# Patient Record
Sex: Female | Born: 1990 | Race: Black or African American | Hispanic: No | Marital: Single | Smoking: Never smoker
Health system: Southern US, Community
[De-identification: ages and names within clinical notes are randomized; demographics above are authoritative.]

## PROBLEM LIST (undated history)

## (undated) DIAGNOSIS — O24419 Gestational diabetes mellitus in pregnancy, unspecified control: Secondary | ICD-10-CM

## (undated) DIAGNOSIS — R87629 Unspecified abnormal cytological findings in specimens from vagina: Secondary | ICD-10-CM

## (undated) DIAGNOSIS — D649 Anemia, unspecified: Secondary | ICD-10-CM

## (undated) HISTORY — DX: Unspecified abnormal cytological findings in specimens from vagina: R87.629

## (undated) HISTORY — DX: Gestational diabetes mellitus in pregnancy, unspecified control: O24.419

## (undated) HISTORY — PX: WISDOM TOOTH EXTRACTION: SHX21

---
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
## 2010-12-17 ENCOUNTER — Inpatient Hospital Stay (INDEPENDENT_AMBULATORY_CARE_PROVIDER_SITE_OTHER)
Admission: RE | Admit: 2010-12-17 | Discharge: 2010-12-17 | Disposition: A | Discharge status: Home / Self Care | Payer: PRIVATE HEALTH INSURANCE | Source: Ambulatory Visit | Attending: Family Medicine | Admitting: Family Medicine

## 2010-12-17 DIAGNOSIS — N390 Urinary tract infection, site not specified: Secondary | ICD-10-CM

## 2010-12-17 LAB — POCT URINALYSIS DIP (DEVICE)
Bilirubin Urine: NEGATIVE
Glucose, UA: NEGATIVE mg/dL
Hgb urine dipstick: NEGATIVE
Ketones, ur: NEGATIVE mg/dL
Nitrite: POSITIVE — AB
Protein, ur: NEGATIVE mg/dL
Specific Gravity, Urine: 1.02 (ref 1.005–1.030)
Urobilinogen, UA: 0.2 mg/dL (ref 0.0–1.0)
pH: 6 (ref 5.0–8.0)

## 2010-12-17 LAB — POCT PREGNANCY, URINE: Preg Test, Ur: NEGATIVE

## 2010-12-30 ENCOUNTER — Inpatient Hospital Stay (INDEPENDENT_AMBULATORY_CARE_PROVIDER_SITE_OTHER)
Admission: RE | Admit: 2010-12-30 | Discharge: 2010-12-30 | Disposition: A | Discharge status: Home / Self Care | Payer: PRIVATE HEALTH INSURANCE | Source: Ambulatory Visit | Attending: Family Medicine | Admitting: Family Medicine

## 2010-12-30 DIAGNOSIS — N390 Urinary tract infection, site not specified: Secondary | ICD-10-CM

## 2010-12-30 LAB — POCT URINALYSIS DIP (DEVICE)
Bilirubin Urine: NEGATIVE
Glucose, UA: NEGATIVE mg/dL
Ketones, ur: NEGATIVE mg/dL
Nitrite: NEGATIVE
Protein, ur: 30 mg/dL — AB
Specific Gravity, Urine: >=1.03 (ref 1.005–1.030)
Urobilinogen, UA: 0.2 mg/dL (ref 0.0–1.0)
pH: 6 (ref 5.0–8.0)

## 2010-12-30 LAB — POCT PREGNANCY, URINE: Preg Test, Ur: NEGATIVE

## 2010-12-31 LAB — URINE CULTURE
Colony Count: NO GROWTH
Culture  Setup Time: 201210151126
Culture: NO GROWTH

## 2013-06-01 ENCOUNTER — Encounter (HOSPITAL_COMMUNITY): Payer: Self-pay | Admitting: Emergency Medicine

## 2013-06-01 ENCOUNTER — Emergency Department (HOSPITAL_COMMUNITY)
Admission: EM | Admit: 2013-06-01 | Discharge: 2013-06-01 | Disposition: A | Discharge status: Home / Self Care | Payer: PRIVATE HEALTH INSURANCE | Attending: Emergency Medicine | Admitting: Emergency Medicine

## 2013-06-01 DIAGNOSIS — Z79899 Other long term (current) drug therapy: Secondary | ICD-10-CM | POA: Insufficient documentation

## 2013-06-01 DIAGNOSIS — Z3202 Encounter for pregnancy test, result negative: Secondary | ICD-10-CM | POA: Insufficient documentation

## 2013-06-01 DIAGNOSIS — K529 Noninfective gastroenteritis and colitis, unspecified: Secondary | ICD-10-CM

## 2013-06-01 DIAGNOSIS — K5289 Other specified noninfective gastroenteritis and colitis: Secondary | ICD-10-CM | POA: Insufficient documentation

## 2013-06-01 DIAGNOSIS — Z87891 Personal history of nicotine dependence: Secondary | ICD-10-CM | POA: Insufficient documentation

## 2013-06-01 LAB — CBC WITH DIFFERENTIAL/PLATELET
Basophils Absolute: 0 10*3/uL (ref 0.0–0.1)
Basophils Relative: 1 % (ref 0–1)
Eosinophils Absolute: 0.2 10*3/uL (ref 0.0–0.7)
Eosinophils Relative: 3 % (ref 0–5)
HCT: 38.6 % (ref 36.0–46.0)
Hemoglobin: 12.4 g/dL (ref 12.0–15.0)
Lymphocytes Relative: 39 % (ref 12–46)
Lymphs Abs: 2.1 10*3/uL (ref 0.7–4.0)
MCH: 25.6 pg — ABNORMAL LOW (ref 26.0–34.0)
MCHC: 32.1 g/dL (ref 30.0–36.0)
MCV: 79.8 fL (ref 78.0–100.0)
Monocytes Absolute: 0.4 10*3/uL (ref 0.1–1.0)
Monocytes Relative: 8 % (ref 3–12)
Neutro Abs: 2.7 10*3/uL (ref 1.7–7.7)
Neutrophils Relative %: 50 % (ref 43–77)
Platelets: 322 10*3/uL (ref 150–400)
RBC: 4.84 MIL/uL (ref 3.87–5.11)
RDW: 13.6 % (ref 11.5–15.5)
WBC: 5.5 10*3/uL (ref 4.0–10.5)

## 2013-06-01 LAB — COMPREHENSIVE METABOLIC PANEL
ALT: 29 U/L (ref 0–35)
AST: 32 U/L (ref 0–37)
Albumin: 3.7 g/dL (ref 3.5–5.2)
Alkaline Phosphatase: 45 U/L (ref 39–117)
BUN: 6 mg/dL (ref 6–23)
CO2: 25 mEq/L (ref 19–32)
Calcium: 8.9 mg/dL (ref 8.4–10.5)
Chloride: 101 mEq/L (ref 96–112)
Creatinine, Ser: 0.62 mg/dL (ref 0.50–1.10)
GFR calc Af Amer: >90 mL/min (ref >90)
GFR calc non Af Amer: >90 mL/min (ref >90)
Glucose, Bld: 81 mg/dL (ref 70–99)
Potassium: 4.1 mEq/L (ref 3.7–5.3)
Sodium: 137 mEq/L (ref 137–147)
Total Bilirubin: 0.2 mg/dL — ABNORMAL LOW (ref 0.3–1.2)
Total Protein: 7.5 g/dL (ref 6.0–8.3)

## 2013-06-01 LAB — URINALYSIS, ROUTINE W REFLEX MICROSCOPIC
Bilirubin Urine: NEGATIVE
Glucose, UA: NEGATIVE mg/dL
Ketones, ur: NEGATIVE mg/dL
Leukocytes, UA: NEGATIVE
Nitrite: NEGATIVE
Protein, ur: NEGATIVE mg/dL
Specific Gravity, Urine: 1.006 (ref 1.005–1.030)
Urobilinogen, UA: 0.2 mg/dL (ref 0.0–1.0)
pH: 6.5 (ref 5.0–8.0)

## 2013-06-01 LAB — URINE MICROSCOPIC-ADD ON

## 2013-06-01 LAB — LIPASE, BLOOD: Lipase: 22 U/L (ref 11–59)

## 2013-06-01 LAB — POC URINE PREG, ED: Preg Test, Ur: NEGATIVE

## 2013-06-01 MED ORDER — LOPERAMIDE HCL 2 MG PO CAPS
4.0000 mg | ORAL_CAPSULE | ORAL | Status: DC | PRN
  Administered 2013-06-01: 4 mg via ORAL
  Filled 2013-06-01: qty 2

## 2013-06-01 MED ORDER — LOPERAMIDE HCL 2 MG PO CAPS: 2.0000 mg | ORAL_CAPSULE | Freq: Four times a day (QID) | ORAL | Status: DC | PRN

## 2013-06-01 NOTE — ED Notes (Signed)
Pt provided ginger ale.  

## 2013-06-01 NOTE — Discharge Instructions (Signed)
Viral Gastroenteritis Viral gastroenteritis is also known as stomach flu. This condition affects the stomach and intestinal tract. It can cause sudden diarrhea and vomiting. The illness typically lasts 3 to 8 days. Most people develop an immune response that eventually gets rid of the virus. While this natural response develops, the virus can make you quite ill. CAUSES  Many different viruses can cause gastroenteritis, such as rotavirus or noroviruses. You can catch one of these viruses by consuming contaminated food or water. You may also catch a virus by sharing utensils or other personal items with an infected person or by touching a contaminated surface. SYMPTOMS  The most common symptoms are diarrhea and vomiting. These problems can cause a severe loss of body fluids (dehydration) and a body salt (electrolyte) imbalance. Other symptoms may include:  Fever.  Headache.  Fatigue.  Abdominal pain. DIAGNOSIS  Your caregiver can usually diagnose viral gastroenteritis based on your symptoms and a physical exam. A stool sample may also be taken to test for the presence of viruses or other infections. TREATMENT  This illness typically goes away on its own. Treatments are aimed at rehydration. The most serious cases of viral gastroenteritis involve vomiting so severely that you are not able to keep fluids down. In these cases, fluids must be given through an intravenous line (IV). HOME CARE INSTRUCTIONS   Drink enough fluids to keep your urine clear or pale yellow. Drink small amounts of fluids frequently and increase the amounts as tolerated.  Ask your caregiver for specific rehydration instructions.  Avoid:  Foods high in sugar.  Alcohol.  Carbonated drinks.  Tobacco.  Juice.  Caffeine drinks.  Extremely hot or cold fluids.  Fatty, greasy foods.  Too much intake of anything at one time.  Dairy products until 24 to 48 hours after diarrhea stops.  You may consume probiotics.  Probiotics are active cultures of beneficial bacteria. They may lessen the amount and number of diarrheal stools in adults. Probiotics can be found in yogurt with active cultures and in supplements.  Wash your hands well to avoid spreading the virus.  Only take over-the-counter or prescription medicines for pain, discomfort, or fever as directed by your caregiver. Do not give aspirin to children. Antidiarrheal medicines are not recommended.  Ask your caregiver if you should continue to take your regular prescribed and over-the-counter medicines.  Keep all follow-up appointments as directed by your caregiver. SEEK IMMEDIATE MEDICAL CARE IF:   You are unable to keep fluids down.  You do not urinate at least once every 6 to 8 hours.  You develop shortness of breath.  You notice blood in your stool or vomit. This may look like coffee grounds.  You have abdominal pain that increases or is concentrated in one small area (localized).  You have persistent vomiting or diarrhea.  You have a fever.  The patient is a child younger than 3 months, and he or she has a fever.  The patient is a child older than 3 months, and he or she has a fever and persistent symptoms.  The patient is a child older than 3 months, and he or she has a fever and symptoms suddenly get worse.  The patient is a baby, and he or she has no tears when crying. MAKE SURE YOU:   Understand these instructions.  Will watch your condition.  Will get help right away if you are not doing well or get worse. Document Released: 03/03/2005 Document Revised: 05/26/2011 Document Reviewed: 12/18/2010   [employer] Patient Information 2014 [employer].  

## 2013-06-01 NOTE — ED Provider Notes (Signed)
CSN: [id number]     Arrival date & time 06/01/13  1149 History   First MD Initiated Contact with Patient 06/01/13 1512     Chief Complaint  Patient presents with  . Diarrhea  . Abdominal Pain     (Consider location/radiation/quality/duration/timing/severity/associated sxs/prior Treatment) Patient is a 23 y.o. female presenting with diarrhea. The history is provided by the patient.  Diarrhea Quality:  Semi-solid Severity:  Moderate Onset quality:  Gradual Number of episodes:  5 Duration:  4 days Timing:  Intermittent Progression:  Improving Relieved by:  Nothing Worsened by:  Nothing tried Ineffective treatments:  None tried Associated symptoms: abdominal pain (sometimes with bms)   Associated symptoms: no chills, no recent cough, no diaphoresis, no fever, no headaches, no URI and no vomiting   Risk factors: suspect food intake (pt ate oysters which she doesnt normally ate and started having sxs the next day)   Risk factors: no recent antibiotic use and no sick contacts     No past medical history on file. No past surgical history on file. No family history on file. History  Substance Use Topics  . Smoking status: Former Smoker    Quit date: 06/01/2012  . Smokeless tobacco: Not on file  . Alcohol Use: Yes     Comment: occasionally    OB History   Grav Para Term Preterm Abortions TAB SAB Ect Mult Living                 Review of Systems  Constitutional: Negative for fever, chills, diaphoresis and activity change.  HENT: Negative for congestion and rhinorrhea.   Eyes: Negative for discharge, redness and itching.  Respiratory: Negative for cough, shortness of breath and wheezing.   Cardiovascular: Negative for chest pain and palpitations.  Gastrointestinal: Positive for abdominal pain (sometimes with bms) and diarrhea. Negative for nausea, vomiting and blood in stool.  Genitourinary: Negative for dysuria, hematuria, vaginal bleeding, vaginal discharge and difficulty  urinating.  Skin: Negative for rash.  Neurological: Negative for syncope and headaches.  Psychiatric/Behavioral: Negative for decreased concentration.      Allergies  Amoxicillin  Home Medications   Current Outpatient Rx  Name  Route  Sig  Dispense  Refill  . norethindrone-ethinyl estradiol (BREVICON, 28,) 0.5-35 MG-MCG tablet   Oral   Take 1 tablet by mouth daily.         Marland Kitchen loperamide (IMODIUM) 2 MG capsule   Oral   Take 1 capsule (2 mg total) by mouth 4 (four) times daily as needed for diarrhea or loose stools.   20 capsule   0    BP 102/61  Pulse 76  Temp(Src) 98.2 F (36.8 C) (Oral)  Resp 20  SpO2 100%  LMP 05/04/2013 Physical Exam  Constitutional: She is oriented to person, place, and time. She appears well-developed and well-nourished. No distress.  Well appearing, NAD, no evidence of dehydration  HENT:  Head: Normocephalic and atraumatic.  Mouth/Throat: Oropharynx is clear and moist.  Eyes: Conjunctivae and EOM are normal. Pupils are equal, round, and reactive to light. Right eye exhibits no discharge. Left eye exhibits no discharge. No scleral icterus.  Neck: Normal range of motion. Neck supple.  Cardiovascular: Normal rate, regular rhythm and intact distal pulses.  Exam reveals no gallop and no friction rub.   No murmur heard. Pulmonary/Chest: Effort normal and breath sounds normal. No respiratory distress. She has no wheezes. She has no rales.  Abdominal: Soft. She exhibits no distension and no mass. There  is no tenderness (soft, no ttp, no rigidity, hyperactive bowel sounds, neg murphy, neg mcburney).  Musculoskeletal: Normal range of motion.  Neurological: She is alert and oriented to person, place, and time. No cranial nerve deficit. She exhibits normal muscle tone. Coordination normal.  Skin: She is not diaphoretic.    ED Course  Procedures (including critical care time) Labs Review Labs Reviewed  CBC WITH DIFFERENTIAL - Abnormal; Notable for the  following:    MCH 25.6 (*)    All other components within normal limits  COMPREHENSIVE METABOLIC PANEL - Abnormal; Notable for the following:    Total Bilirubin 0.2 (*)    All other components within normal limits  URINALYSIS, ROUTINE W REFLEX MICROSCOPIC - Abnormal; Notable for the following:    Hgb urine dipstick SMALL (*)    All other components within normal limits  LIPASE, BLOOD  URINE MICROSCOPIC-ADD ON  POC URINE PREG, ED   Imaging Review No results found.   EKG Interpretation None      MDM   MDM: 22 y.o AAF w/ 4 days of diarrhea. Nausea no vomiting. Intermittent abd pain when having BM, none currently. Denies f/c. No blood in stool. States [facility] [employer] which she started sxs next day after them. Here AFVSS, well appaering, no evidence of dehdyration. Abd soft, non tender, hyperactive BS. Unlikely acute abdomen with no abd ttp or pain, well appaering vitals, not dehdyrated. Will discharge. Imodium for sxs. Care of case d/w my attending. Follow up PCP if not better. Return to ED if not able to tolerate po.  Final diagnoses:  Gastroenteritis    Discharged  Pilar Jarvisoug Elver Stadler, MD 06/01/13 [phone]

## 2013-06-01 NOTE — ED Notes (Signed)
Pt reports diarrhea, generalized abdominal pain, nausea, SOB with exertion and weakness x 5 days. Denies CP. NAD. Lungs clear.

## 2013-06-02 NOTE — ED Provider Notes (Signed)
This patient was seen in conjunction with the resident physician, Dr. Marcha SoldersBrtalik.  The documentation is accurate and reflects the patient's ED course.  On my exam the patient appears uncomfortable, but was not in extremis.  We had a length conversation about the possible food reaction versus viral illness.  Patient was discharged in stable condition  Gerhard Munchobert Rayleen Wyrick, MD 06/02/13 [phone]

## 2015-01-03 ENCOUNTER — Encounter (HOSPITAL_COMMUNITY): Payer: Self-pay | Admitting: Family Medicine

## 2015-01-03 ENCOUNTER — Emergency Department (HOSPITAL_COMMUNITY)
Admission: EM | Admit: 2015-01-03 | Discharge: 2015-01-03 | Disposition: A | Discharge status: Home / Self Care | Payer: PRIVATE HEALTH INSURANCE | Attending: Emergency Medicine | Admitting: Emergency Medicine

## 2015-01-03 DIAGNOSIS — R1084 Generalized abdominal pain: Secondary | ICD-10-CM | POA: Insufficient documentation

## 2015-01-03 DIAGNOSIS — Z3202 Encounter for pregnancy test, result negative: Secondary | ICD-10-CM | POA: Insufficient documentation

## 2015-01-03 DIAGNOSIS — R197 Diarrhea, unspecified: Secondary | ICD-10-CM | POA: Insufficient documentation

## 2015-01-03 DIAGNOSIS — Z793 Long term (current) use of hormonal contraceptives: Secondary | ICD-10-CM | POA: Insufficient documentation

## 2015-01-03 DIAGNOSIS — Z880 Allergy status to penicillin: Secondary | ICD-10-CM | POA: Insufficient documentation

## 2015-01-03 DIAGNOSIS — Z87891 Personal history of nicotine dependence: Secondary | ICD-10-CM | POA: Insufficient documentation

## 2015-01-03 LAB — CBC
HCT: 34.6 % — ABNORMAL LOW (ref 36.0–46.0)
Hemoglobin: 10.9 g/dL — ABNORMAL LOW (ref 12.0–15.0)
MCH: 25.1 pg — ABNORMAL LOW (ref 26.0–34.0)
MCHC: 31.5 g/dL (ref 30.0–36.0)
MCV: 79.7 fL (ref 78.0–100.0)
Platelets: 296 10*3/uL (ref 150–400)
RBC: 4.34 MIL/uL (ref 3.87–5.11)
RDW: 13.7 % (ref 11.5–15.5)
WBC: 8.9 10*3/uL (ref 4.0–10.5)

## 2015-01-03 LAB — URINALYSIS, ROUTINE W REFLEX MICROSCOPIC
Bilirubin Urine: NEGATIVE
Glucose, UA: NEGATIVE mg/dL
Ketones, ur: NEGATIVE mg/dL
Nitrite: NEGATIVE
Protein, ur: NEGATIVE mg/dL
Specific Gravity, Urine: 1.018 (ref 1.005–1.030)
Urobilinogen, UA: 1 mg/dL (ref 0.0–1.0)
pH: 6 (ref 5.0–8.0)

## 2015-01-03 LAB — URINE MICROSCOPIC-ADD ON

## 2015-01-03 LAB — COMPREHENSIVE METABOLIC PANEL
ALT: 32 U/L (ref 14–54)
AST: 35 U/L (ref 15–41)
Albumin: 3.8 g/dL (ref 3.5–5.0)
Alkaline Phosphatase: 43 U/L (ref 38–126)
Anion gap: 11 (ref 5–15)
BUN: <5 mg/dL — ABNORMAL LOW (ref 6–20)
CO2: 25 mmol/L (ref 22–32)
Calcium: 9.1 mg/dL (ref 8.9–10.3)
Chloride: 104 mmol/L (ref 101–111)
Creatinine, Ser: 0.72 mg/dL (ref 0.44–1.00)
GFR calc Af Amer: >60 mL/min (ref >60)
GFR calc non Af Amer: >60 mL/min (ref >60)
Glucose, Bld: 84 mg/dL (ref 65–99)
Potassium: 3.7 mmol/L (ref 3.5–5.1)
Sodium: 140 mmol/L (ref 135–145)
Total Bilirubin: 0.3 mg/dL (ref 0.3–1.2)
Total Protein: 7.1 g/dL (ref 6.5–8.1)

## 2015-01-03 LAB — I-STAT BETA HCG BLOOD, ED (MC, WL, AP ONLY): I-stat hCG, quantitative: <5 m[IU]/mL (ref <5)

## 2015-01-03 LAB — LIPASE, BLOOD: Lipase: 21 U/L — ABNORMAL LOW (ref 22–51)

## 2015-01-03 MED ORDER — DIPHENOXYLATE-ATROPINE 2.5-0.025 MG PO TABS: 1.0000 | ORAL_TABLET | Freq: Four times a day (QID) | ORAL | Status: DC | PRN

## 2015-01-03 MED ORDER — DIPHENOXYLATE-ATROPINE 2.5-0.025 MG PO TABS
2.0000 | ORAL_TABLET | Freq: Once | ORAL | Status: AC
  Administered 2015-01-03: 2 via ORAL
  Filled 2015-01-03: qty 2

## 2015-01-03 NOTE — Discharge Instructions (Signed)
Take the anti-diarrhea medication as directed. Make sure to drink plenty of water to stay hydrated. Stick to a simple diet such as the [employer] diet (banana, rice, applesauce, toast). Return to the ER if you start having fever, chills, bloody diarrhea, or other concerning symptoms.  Please obtain all of your results from medical records or have your doctors office obtain the results - share them with your doctor - you should be seen at your doctors office in the next 2 days. Call today to arrange your follow up. Take the medications as prescribed. Please review all of the medicines and only take them if you do not have an allergy to them. Please be aware that if you are taking birth control pills, taking other prescriptions, ESPECIALLY ANTIBIOTICS may make the birth control ineffective - if this is the case, either do not engage in sexual activity or use alternative methods of birth control such as condoms until you have finished the medicine and your family doctor says it is OK to restart them. If you are on a blood thinner such as COUMADIN, be aware that any other medicine that you take may cause the coumadin to either work too much, or not enough - you should have your coumadin level rechecked in next 7 days if this is the case.  ?  It is also a possibility that you have an allergic reaction to any of the medicines that you have been prescribed - Everybody reacts differently to medications and while MOST people have no trouble with most medicines, you may have a reaction such as nausea, vomiting, rash, swelling, shortness of breath. If this is the case, please stop taking the medicine immediately and contact your physician.  ?  You should return to the ER if you develop severe or worsening symptoms.

## 2015-01-03 NOTE — ED Notes (Signed)
Pt here for diarrhea x 1 week. sts sharp pain. sts took preg test and was negative.

## 2015-01-03 NOTE — ED Provider Notes (Signed)
CSN: [id number]     Arrival date & time 01/03/15  1847 History   First MD Initiated Contact with Patient 01/03/15 1958     Chief Complaint  Patient presents with  . Diarrhea     HPI  Ms. Wingard is an 24 y.o. otherwise healthy female who presents to the ED for evaluation of diarrhea. States her symptoms started on 10/13. She reports 4-5 episodes of watery diarrhea a day. She also endorses diffuse abdominal cramping that is relieved with defecation. Pt reports blood on wiping a couple of times but denies blood in the toilet bowl or in the stool. Denies recent travel, new or raw foods, or antibiotic use. She states she has tried pepto and immodium with no relief.  History reviewed. No pertinent past medical history. History reviewed. No pertinent past surgical history. History reviewed. No pertinent family history. Social History  Substance Use Topics  . Smoking status: Former Smoker    Quit date: 06/01/2012  . Smokeless tobacco: None  . Alcohol Use: Yes     Comment: occasionally    OB History    No data available     Review of Systems  All other systems reviewed and are negative.     Allergies  Amoxicillin  Home Medications   Prior to Admission medications   Medication Sig Start Date End Date Taking? Authorizing Provider  diphenoxylate-atropine (LOMOTIL) 2.5-0.025 MG tablet Take 1-2 tablets by mouth 4 (four) times daily as needed for diarrhea or loose stools. 01/03/15   Ace Gins Almedia Cordell, PA-C  loperamide (IMODIUM) 2 MG capsule Take 1 capsule (2 mg total) by mouth 4 (four) times daily as needed for diarrhea or loose stools. 06/01/13   Pilar Jarvis, MD  norethindrone-ethinyl estradiol (BREVICON, 28,) 0.5-35 MG-MCG tablet Take 1 tablet by mouth daily.    Historical Provider, MD   BP 117/70 mmHg  Pulse 74  Temp(Src) 99 F (37.2 C) (Oral)  Resp 18  Ht  (1.626 m)  Wt 168 lb (76.204 kg)  BMI 28.82 kg/m2  SpO2 98%  LMP 10/18/2014 Physical Exam  HENT:  Right Ear: External  ear normal.  Left Ear: External ear normal.  Nose: Nose normal.  Mouth/Throat: Oropharynx is clear and moist. No oropharyngeal exudate.  Eyes: Conjunctivae and EOM are normal. Pupils are equal, round, and reactive to light.  Neck: Normal range of motion. Neck supple. No tracheal deviation present.  Cardiovascular: Normal rate, regular rhythm, normal heart sounds and intact distal pulses.   No murmur heard. Pulmonary/Chest: Effort normal and breath sounds normal. No respiratory distress. She has no wheezes.  Abdominal: Soft. Bowel sounds are normal. She exhibits no distension. There is no tenderness. There is no rebound and no guarding.  Musculoskeletal: She exhibits no edema.  Lymphadenopathy:    She has no cervical adenopathy.  Skin: Skin is warm and dry.  Nursing note and vitals reviewed.   ED Course  Procedures (including critical care time) Labs Review Labs Reviewed  LIPASE, BLOOD - Abnormal; Notable for the following:    Lipase 21 (*)    All other components within normal limits  COMPREHENSIVE METABOLIC PANEL - Abnormal; Notable for the following:    BUN <5 (*)    All other components within normal limits  CBC - Abnormal; Notable for the following:    Hemoglobin 10.9 (*)    HCT 34.6 (*)    MCH 25.1 (*)    All other components within normal limits  URINALYSIS, ROUTINE W REFLEX  MICROSCOPIC (NOT AT [facility]) - Abnormal; Notable for the following:    APPearance CLOUDY (*)    Hgb urine dipstick SMALL (*)    Leukocytes, UA SMALL (*)    All other components within normal limits  URINE MICROSCOPIC-ADD ON - Abnormal; Notable for the following:    Squamous Epithelial / LPF FEW (*)    Bacteria, UA FEW (*)    All other components within normal limits  I-STAT BETA HCG BLOOD, ED ([facility], [facility], [facility] ONLY)    Imaging Review No results found. I have personally reviewed and evaluated these images and lab results as part of my medical decision-making.   EKG Interpretation None      MDM    Final diagnoses:  Diarrhea, unspecified type    Workup unremarkable. Discussed option to leave stool sample for c diff testing but pt would like to hold off and try Lomotil for now. Gave pt rx for Lomotil and encouraged BRAT diet. Discussed return precautions.     Carlene CoriaSerena Y Courtany Mcmurphy, PA-C 01/03/15 2116  Eber HongBrian Miller, MD 01/06/15 1226

## 2015-09-12 ENCOUNTER — Encounter (HOSPITAL_COMMUNITY): Payer: Self-pay | Admitting: *Deleted

## 2015-09-12 DIAGNOSIS — Y999 Unspecified external cause status: Secondary | ICD-10-CM | POA: Insufficient documentation

## 2015-09-12 DIAGNOSIS — Y929 Unspecified place or not applicable: Secondary | ICD-10-CM | POA: Insufficient documentation

## 2015-09-12 DIAGNOSIS — W57XXXA Bitten or stung by nonvenomous insect and other nonvenomous arthropods, initial encounter: Secondary | ICD-10-CM | POA: Insufficient documentation

## 2015-09-12 DIAGNOSIS — Z87891 Personal history of nicotine dependence: Secondary | ICD-10-CM | POA: Insufficient documentation

## 2015-09-12 DIAGNOSIS — Y939 Activity, unspecified: Secondary | ICD-10-CM | POA: Insufficient documentation

## 2015-09-12 DIAGNOSIS — L02415 Cutaneous abscess of right lower limb: Secondary | ICD-10-CM | POA: Insufficient documentation

## 2015-09-12 NOTE — ED Notes (Signed)
The pt is c/o pain in her rt thigh she thinks she was bitten by a spider 2 days ago  Red  Circular area red approx size large grapefruit  No known temp

## 2015-09-13 ENCOUNTER — Emergency Department (HOSPITAL_COMMUNITY)
Admission: EM | Admit: 2015-09-13 | Discharge: 2015-09-13 | Disposition: A | Discharge status: Home / Self Care | Payer: PRIVATE HEALTH INSURANCE | Attending: Emergency Medicine | Admitting: Emergency Medicine

## 2015-09-13 DIAGNOSIS — L02415 Cutaneous abscess of right lower limb: Secondary | ICD-10-CM

## 2015-09-13 MED ORDER — SULFAMETHOXAZOLE-TRIMETHOPRIM 800-160 MG PO TABS
1.0000 | ORAL_TABLET | Freq: Once | ORAL | Status: AC
  Administered 2015-09-13: 1 via ORAL

## 2015-09-13 MED ORDER — SULFAMETHOXAZOLE-TRIMETHOPRIM 800-160 MG PO TABS: 1.0000 | ORAL_TABLET | Freq: Two times a day (BID) | ORAL | Status: DC

## 2015-09-13 MED ORDER — LIDOCAINE HCL 2 % IJ SOLN
10.0000 mL | Freq: Once | INTRAMUSCULAR | Status: AC
  Administered 2015-09-13: 200 mg
  Filled 2015-09-13: qty 20

## 2015-09-13 NOTE — ED Notes (Signed)
Lidocaine and I&D tray at bedside 

## 2015-09-13 NOTE — ED Provider Notes (Signed)
CSN: [id number]     Arrival date & time 09/12/15  2340 History  By signing my name below, I, Phillis HaggisGabriella Gaje, attest that this documentation has been prepared under the direction and in the presence of Dione Boozeavid Aasiya Creasey, MD. Electronically Signed: Phillis HaggisGabriella Gaje, ED Scribe. 09/13/2015. 2:21 AM.     Chief Complaint  Patient presents with  . Insect Bite   The history is provided by the patient. No language interpreter was used.  HPI Comments: Lauren Aguilar is a 25 y.o. female who presents to the Emergency Department complaining of a gradually worsening, itching insect bite to the right thigh onset 2 days ago. She reports associated swelling and redness to the area. She states that there was pus in the area, but she popped it. She believes that she was bitten by a spider. She reports worsening pain with ambulation. She has used benadryl, calamine lotion, and peroxide to the area to no relief. Pt denies fever, chills, diaphoresis, trouble swallowing, SOB, chest pain, nausea, or vomiting.   History reviewed. No pertinent past medical history. History reviewed. No pertinent past surgical history. No family history on file. Social History  Substance Use Topics  . Smoking status: Former Smoker    Quit date: 06/01/2012  . Smokeless tobacco: None  . Alcohol Use: Yes     Comment: occasionally    OB History    No data available     Review of Systems  Constitutional: Negative for fever, chills and diaphoresis.  HENT: Negative for trouble swallowing.   Respiratory: Negative for shortness of breath.   Cardiovascular: Negative for chest pain.  Gastrointestinal: Negative for nausea and vomiting.  Skin: Positive for rash.  All other systems reviewed and are negative.  Allergies  Amoxicillin  Home Medications   Prior to Admission medications   Medication Sig Start Date End Date Taking? Authorizing Provider  diphenoxylate-atropine (LOMOTIL) 2.5-0.025 MG tablet Take 1-2 tablets by mouth 4 (four) times  daily as needed for diarrhea or loose stools. 01/03/15   Ace GinsSerena Y Sam, PA-C  loperamide (IMODIUM) 2 MG capsule Take 1 capsule (2 mg total) by mouth 4 (four) times daily as needed for diarrhea or loose stools. 06/01/13   Pilar Jarvisoug Brtalik, MD  norethindrone-ethinyl estradiol (BREVICON, 28,) 0.5-35 MG-MCG tablet Take 1 tablet by mouth daily.    Historical Provider, MD   BP 107/73 mmHg  Pulse 64  Temp(Src) 98.1 F (36.7 C) (Oral)  Resp 18  Ht 5\' 4"  (1.626 m)  Wt 167 lb 1 oz (75.779 kg)  BMI 28.66 kg/m2  SpO2 100%  LMP 05/15/2015 Physical Exam  Constitutional: She is oriented to person, place, and time. She appears well-developed and well-nourished.  HENT:  Head: Normocephalic and atraumatic.  Eyes: EOM are normal. Pupils are equal, round, and reactive to light.  Neck: Normal range of motion. Neck supple. No JVD present.  Cardiovascular: Normal rate, regular rhythm and normal heart sounds.  Exam reveals no gallop and no friction rub.   No murmur heard. Pulmonary/Chest: Effort normal and breath sounds normal. She has no wheezes. She has no rales. She exhibits no tenderness.  Abdominal: Soft. Bowel sounds are normal. She exhibits no distension and no mass. There is no tenderness.  Musculoskeletal: Normal range of motion. She exhibits no edema.  Anterior right thigh: 11x11 cm area of erythema and mild induration  Lymphadenopathy:    She has no cervical adenopathy.  Neurological: She is alert and oriented to person, place, and time. No cranial nerve deficit.  She exhibits normal muscle tone. Coordination normal.  Skin: Skin is warm and dry. No rash noted.  Psychiatric: She has a normal mood and affect. Her behavior is normal. Thought content normal.  Nursing note and vitals reviewed.   ED Course  Procedures (including critical care time) DIAGNOSTIC STUDIES: Oxygen Saturation is 100% on RA, normal by my interpretation.    COORDINATION OF CARE: 2:20 AM-Discussed treatment plan which includes  US of the area with pt at bedside and pt agreed to plan.   EMERGENCY DEPARTMENT US SOFT TISSUE INTERPRETATION "Study: Limited Ultrasound of the noted body part in comments below"  INDICATIONS: Soft tissue infection Multiple views of the body part are obtained with a multi-frequency linear probe  PERFORMED BY:  Myself  IMAGES ARCHIVED?: Yes  SIDE:Right   BODY PART:Lower extremity  FINDINGS: Abcess present  LIMITATIONS:  None  INTERPRETATION:  Abcess present  COMMENT:  Patient tolerated procedure well  INCISION AND DRAINAGE Performed by: ZOXWR,UEAVWGLICK,Naleyah Ohlinger Consent: Verbal consent obtained. Risks and benefits: risks, benefits and alternatives were discussed Type: abscess  Body area: Right thigh  Anesthesia: local infiltration  Incision was made with a scalpel.  Local anesthetic: lidocaine 2% without epinephrine  Anesthetic total: 4 ml  Complexity: complex Blunt dissection to break up loculations  Drainage: purulent  Drainage amount: Moderate   Packing material: None   Patient tolerance: Patient tolerated the procedure well with no immediate complications.        MDM   Final diagnoses:  Abscess of right thigh    Abscess of the right thigh treated with incision and drainage following ultrasound localization of fluid. She is discharged with prescription for trimethoprim-sulfamethoxazole and is advised to follow-up in urgent care for 2 days to ensure appropriate clinical response.  I personally performed the services described in this documentation, which was scribed in my presence. The recorded information has been reviewed and is accurate.      Dione Boozeavid Lya Holben, MD 09/13/15 [phone]

## 2015-09-13 NOTE — Discharge Instructions (Signed)
Take diphenhydramine (Benadryl) as needed for itching. Take ibuprofen or acetaminophen as needed for pain.  Abscess An abscess is an infected area that contains a collection of pus and debris.It can occur in almost any part of the body. An abscess is also known as a furuncle or boil. CAUSES  An abscess occurs when tissue gets infected. This can occur from blockage of oil or sweat glands, infection of hair follicles, or a minor injury to the skin. As the body tries to fight the infection, pus collects in the area and creates pressure under the skin. This pressure causes pain. People with weakened immune systems have difficulty fighting infections and get certain abscesses more often.  SYMPTOMS Usually an abscess develops on the skin and becomes a painful mass that is red, warm, and tender. If the abscess forms under the skin, you may feel a moveable soft area under the skin. Some abscesses break open (rupture) on their own, but most will continue to get worse without care. The infection can spread deeper into the body and eventually into the bloodstream, causing you to feel ill.  DIAGNOSIS  Your caregiver will take your medical history and perform a physical exam. A sample of fluid may also be taken from the abscess to determine what is causing your infection. TREATMENT  Your caregiver may prescribe antibiotic medicines to fight the infection. However, taking antibiotics alone usually does not cure an abscess. Your caregiver may need to make a small cut (incision) in the abscess to drain the pus. In some cases, gauze is packed into the abscess to reduce pain and to continue draining the area. HOME CARE INSTRUCTIONS   Only take over-the-counter or prescription medicines for pain, discomfort, or fever as directed by your caregiver.  If you were prescribed antibiotics, take them as directed. Finish them even if you start to feel better.  If gauze is used, follow your caregiver's directions for  changing the gauze.  To avoid spreading the infection:  Keep your draining abscess covered with a bandage.  Wash your hands well.  Do not share personal care items, towels, or whirlpools with others.  Avoid skin contact with others.  Keep your skin and clothes clean around the abscess.  Keep all follow-up appointments as directed by your caregiver. SEEK MEDICAL CARE IF:   You have increased pain, swelling, redness, fluid drainage, or bleeding.  You have muscle aches, chills, or a general ill feeling.  You have a fever. MAKE SURE YOU:   Understand these instructions.  Will watch your condition.  Will get help right away if you are not doing well or get worse.   This information is not intended to replace advice given to you by your health care provider. Make sure you discuss any questions you have with your health care provider.   Document Released: 12/11/2004 Document Revised: 09/02/2011 Document Reviewed: 05/16/2011 Elsevier Interactive Patient Education 2016 Elsevier Inc.   Incision and Drainage Incision and drainage is a procedure in which a sac-like structure (cystic structure) is opened and drained. The area to be drained usually contains material such as pus, fluid, or blood.  LET YOUR CAREGIVER KNOW ABOUT:   Allergies to medicine.  Medicines taken, including vitamins, herbs, eyedrops, over-the-counter medicines, and creams.  Use of steroids (by mouth or creams).  Previous problems with anesthetics or numbing medicines.  History of bleeding problems or blood clots.  Previous surgery.  Other health problems, including diabetes and kidney problems.  Possibility of pregnancy, if  this applies. RISKS AND COMPLICATIONS  Pain.  Bleeding.  Scarring.  Infection. BEFORE THE PROCEDURE  You may need to have an ultrasound or other imaging tests to see how large or deep your cystic structure is. Blood tests may also be used to determine if you have an  infection or how severe the infection is. You may need to have a tetanus shot. PROCEDURE  The affected area is cleaned with a cleaning fluid. The cyst area will then be numbed with a medicine (local anesthetic). A small incision will be made in the cystic structure. A syringe or catheter may be used to drain the contents of the cystic structure, or the contents may be squeezed out. The area will then be flushed with a cleansing solution. After cleansing the area, it is often gently packed with a gauze or another wound dressing. Once it is packed, it will be covered with gauze and tape or some other type of wound dressing. AFTER THE PROCEDURE   Often, you will be allowed to go home right after the procedure.  You may be given antibiotic medicine to prevent or heal an infection.  If the area was packed with gauze or some other wound dressing, you will likely need to come back in 1 to 2 days to get it removed.  The area should heal in about 14 days.   This information is not intended to replace advice given to you by your health care provider. Make sure you discuss any questions you have with your health care provider.   Document Released: 08/27/2000 Document Revised: 09/02/2011 Document Reviewed: 04/28/2011 Elsevier Interactive Patient Education 2016 Elsevier Inc.  Sulfamethoxazole; Trimethoprim, SMX-TMP tablets What is this medicine? SULFAMETHOXAZOLE; TRIMETHOPRIM or SMX-TMP (suhl fuh meth OK suh zohl; trye METH oh prim) is a combination of a sulfonamide antibiotic and a second antibiotic, trimethoprim. It is used to treat or prevent certain kinds of bacterial infections. It will not work for colds, flu, or other viral infections. This medicine may be used for other purposes; ask your health care provider or pharmacist if you have questions. What should I tell my health care provider before I take this medicine? They need to know if you have any of these conditions: -anemia -asthma -being  treated with anticonvulsants -if you frequently drink alcohol containing drinks -kidney disease -liver disease -low level of folic acid or [id number]-3-[id number]-6-phosphate dehydrogenase -poor nutrition or malabsorption -porphyria -severe allergies -thyroid disorder -an unusual or allergic reaction to sulfamethoxazole, trimethoprim, sulfa drugs, other medicines, foods, dyes, or preservatives -pregnant or trying to get pregnant -breast-feeding How should I use this medicine? Take this medicine by mouth with a full glass of water. Follow the directions on the prescription label. Take your medicine at regular intervals. Do not take it more often than directed. Do not skip doses or stop your medicine early. Talk to your pediatrician regarding the use of this medicine in children. Special care may be needed. This medicine has been used in children as young as 222 months of age. Overdosage: If you think you have taken too much of this medicine contact a poison control center or emergency room at once. NOTE: This medicine is only for you. Do not share this medicine with others. What if I miss a dose? If you miss a dose, take it as soon as you can. If it is almost time for your next dose, take only that dose. Do not take double or extra doses. What may interact with this medicine?  Do not take this medicine with any of the following medications: -aminobenzoate potassium -dofetilide -metronidazole This medicine may also interact with the following medications: -ACE inhibitors like benazepril, enalapril, lisinopril, and ramipril -birth control pills -cyclosporine -digoxin -diuretics -indomethacin -medicines for diabetes -methenamine -methotrexate -phenytoin -potassium supplements -pyrimethamine -sulfinpyrazone -tricyclic antidepressants -warfarin This list may not describe all possible interactions. Give your health care provider a list of all the medicines, herbs, non-prescription drugs, or dietary  supplements you use. Also tell them if you smoke, drink alcohol, or use illegal drugs. Some items may interact with your medicine. What should I watch for while using this medicine? Tell your doctor or health care professional if your symptoms do not improve. Drink several glasses of water a day to reduce the risk of kidney problems. Do not treat diarrhea with over the counter products. Contact your doctor if you have diarrhea that lasts more than 2 days or if it is severe and watery. This medicine can make you more sensitive to the sun. Keep out of the sun. If you cannot avoid being in the sun, wear protective clothing and use a sunscreen. Do not use sun lamps or tanning beds/booths. What side effects may I notice from receiving this medicine? Side effects that you should report to your doctor or health care professional as soon as possible: -allergic reactions like skin rash or hives, swelling of the face, lips, or tongue -breathing problems -fever or chills, sore throat -irregular heartbeat, chest pain -joint or muscle pain -pain or difficulty passing urine -red pinpoint spots on skin -redness, blistering, peeling or loosening of the skin, including inside the mouth -unusual bleeding or bruising -unusually weak or tired -yellowing of the eyes or skin Side effects that usually do not require medical attention (report to your doctor or health care professional if they continue or are bothersome): -diarrhea -dizziness -headache -loss of appetite -nausea, vomiting -nervousness This list may not describe all possible side effects. Call your doctor for medical advice about side effects. You may report side effects to FDA at 1-800-FDA-1088. Where should I keep my medicine? Keep out of the reach of children. Store at room temperature between 20 to 25 degrees C (68 to 77 degrees F). Protect from light. Throw away any unused medicine after the expiration date. NOTE: This sheet is a summary. It  may not cover all possible information. If you have questions about this medicine, talk to your doctor, pharmacist, or health care provider.    2016, Elsevier/Gold Standard. (2012-10-08 14:38:26)

## 2015-11-21 ENCOUNTER — Telehealth (HOSPITAL_COMMUNITY): Payer: Self-pay | Admitting: *Deleted

## 2015-11-21 ENCOUNTER — Encounter (HOSPITAL_COMMUNITY): Payer: Self-pay | Admitting: *Deleted

## 2015-11-21 NOTE — Telephone Encounter (Signed)
Telephoned patient at home number and left message to return call to [facility] 

## 2015-11-30 ENCOUNTER — Encounter (HOSPITAL_COMMUNITY): Payer: Self-pay

## 2015-11-30 ENCOUNTER — Ambulatory Visit (HOSPITAL_COMMUNITY)
Admission: RE | Admit: 2015-11-30 | Discharge: 2015-11-30 | Disposition: A | Discharge status: Home / Self Care | Payer: Self-pay | Source: Ambulatory Visit | Attending: Obstetrics and Gynecology | Admitting: Obstetrics and Gynecology

## 2015-11-30 VITALS — BP 110/60 | Temp 98.5°F | Ht 64.0 in | Wt 165.8 lb

## 2015-11-30 DIAGNOSIS — R87610 Atypical squamous cells of undetermined significance on cytologic smear of cervix (ASC-US): Secondary | ICD-10-CM

## 2015-11-30 DIAGNOSIS — R87810 Cervical high risk human papillomavirus (HPV) DNA test positive: Secondary | ICD-10-CM

## 2015-11-30 DIAGNOSIS — Z1239 Encounter for other screening for malignant neoplasm of breast: Secondary | ICD-10-CM

## 2015-11-30 NOTE — Patient Instructions (Signed)
Explained breast self awareness to Foot LockerCharisse Aguilar. Patient did not need a Pap smear today due to last Pap smear was 10/17/2015. Told patient about free cervical cancer screenings to receive a Pap smear if would like one next year. Explained the colposcopy and the importance of follow up. [facility] to follow up for abnormal Pap smear. Appointment scheduled for Monday, December 17, 2015 at 1400. Patient aware of appointment and will be there. Informed patient that a screening mammogram is recommended at age 25 unless clinically indicated prior. [name] verbalized understanding.  Manish Ruggiero, Kathaleen Maserhristine Poll, RN 11:41 AM

## 2015-11-30 NOTE — Progress Notes (Signed)
[facility] due to having an abnormal Pap smear 10/17/2015 that a colposcopy is recommended for follow up.  Pap Smear: Pap smear not completed today. [facility] with positive HPV. [facility] for a colposcopy to follow up for abnormal Pap smear. Appointment scheduled for Monday, December 17, 2015 at 1400. Per patient has history of one other abnormal Pap smear in 2016 that a repeat Pap smear was completed for follow up that normal. Last Pap smear result is in EPIC.  Physical exam: Breasts Breasts symmetrical. No skin abnormalities bilateral breasts. No nipple retraction bilateral breasts. No nipple discharge bilateral breasts. No lymphadenopathy. No lumps palpated bilateral breasts. No complaints of pain or tenderness on exam. Screening mammogram recommended at age 25 unless clinically indicated prior.     Pelvic/Bimanual No Pap smear completed today since last Pap smear was 10/17/2015. Pap smear not indicated per [facility] guidelines.   Smoking History: Patient has never smoked.  Patient Navigation: Patient education provided. [facility] to services [facility] for [facility].

## 2015-12-04 ENCOUNTER — Encounter (HOSPITAL_COMMUNITY): Payer: Self-pay | Admitting: *Deleted

## 2015-12-17 ENCOUNTER — Other Ambulatory Visit (HOSPITAL_COMMUNITY)
Admission: RE | Admit: 2015-12-17 | Discharge: 2015-12-17 | Disposition: A | Discharge status: Home / Self Care | Payer: No Typology Code available for payment source | Source: Ambulatory Visit | Attending: Family Medicine | Admitting: Family Medicine

## 2015-12-17 ENCOUNTER — Encounter: Payer: Self-pay | Admitting: Family Medicine

## 2015-12-17 ENCOUNTER — Ambulatory Visit (INDEPENDENT_AMBULATORY_CARE_PROVIDER_SITE_OTHER): Payer: Self-pay | Admitting: Family Medicine

## 2015-12-17 VITALS — BP 108/69 | HR 76 | Ht 64.0 in | Wt 166.2 lb

## 2015-12-17 DIAGNOSIS — R87810 Cervical high risk human papillomavirus (HPV) DNA test positive: Secondary | ICD-10-CM

## 2015-12-17 DIAGNOSIS — Z3202 Encounter for pregnancy test, result negative: Secondary | ICD-10-CM

## 2015-12-17 DIAGNOSIS — R87610 Atypical squamous cells of undetermined significance on cytologic smear of cervix (ASC-US): Secondary | ICD-10-CM

## 2015-12-17 DIAGNOSIS — N879 Dysplasia of cervix uteri, unspecified: Secondary | ICD-10-CM | POA: Insufficient documentation

## 2015-12-17 DIAGNOSIS — G8918 Other acute postprocedural pain: Secondary | ICD-10-CM

## 2015-12-17 LAB — POCT PREGNANCY, URINE: Preg Test, Ur: NEGATIVE

## 2015-12-17 MED ORDER — IBUPROFEN 200 MG PO TABS
800.0000 mg | ORAL_TABLET | Freq: Once | ORAL | Status: AC
  Administered 2015-12-17: 800 mg via ORAL

## 2015-12-17 NOTE — Progress Notes (Signed)
GYNECOLOGY CLINIC COLPOSCOPY VISIT AND PROCEDURE NOTE  25 y.o. 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 LSIL with +HPV at 02/14/14, ASCUS +HPV on 10/26/12. Prior cervical cytology and/or colposcopy findings: None performed. The patient reports the following prior treatments to the vulva/vagina/cervix: None. The patient is not a cigarette smoker. The patient is not immunosuppressed. The patient is not pregnant. The patient is not taking anticoagulants and has no allergy to iodine.  Patient given informed consent, signed copy in the chart, time out was performed. Urine pregnancy test performed and confirmed to be negative.  Placed in lithotomy position.    Gross findings:   Vagina: Normal external female genitalia  Vulva: Normal appearing  Cervix: Pink, normal physiologic discharge, normal in appearance without gross findings  Visualization after:  Acetic acid: Punctate lesions throughout and mosaicism at 2 o'clock and 7 o'clock, sharp acetowhite borders at 7 o'clock position.  Upper one-third of vagina examined, revealing: normal vaginal mucosa  Biopsies obtained: 2 o'clock and 7 o'clock positions  ECC specimen obtained: Yes  Colposcopy adequate? Yes  All specimens were labelled and sent to pathology.   Patient was given post procedure instructions. Patient was given 800 mg IBuprofen prior to procedure. Will follow up pathology and manage accordingly.      Jen MowElizabeth Baleria Wyman, DO OB/GYN Fellow

## 2015-12-17 NOTE — Patient Instructions (Signed)

## 2016-12-23 ENCOUNTER — Encounter (HOSPITAL_COMMUNITY): Payer: Self-pay | Admitting: *Deleted

## 2016-12-23 ENCOUNTER — Ambulatory Visit (HOSPITAL_COMMUNITY)
Admission: RE | Admit: 2016-12-23 | Discharge: 2016-12-23 | Disposition: A | Discharge status: Home / Self Care | Payer: No Typology Code available for payment source | Source: Ambulatory Visit | Attending: Obstetrics and Gynecology | Admitting: Obstetrics and Gynecology

## 2016-12-23 ENCOUNTER — Encounter (HOSPITAL_COMMUNITY): Payer: Self-pay

## 2016-12-23 VITALS — BP 102/60 | Ht 64.0 in | Wt 169.5 lb

## 2016-12-23 DIAGNOSIS — Z01419 Encounter for gynecological examination (general) (routine) without abnormal findings: Secondary | ICD-10-CM

## 2016-12-23 NOTE — Progress Notes (Signed)
No complaints today.   Pap Smear: Pap smear completed today. Last Pap smear was 10/17/2015 at the [facility] and ASCUS with positive HPV. Patient had a colposcopy 12/17/2015 that was benign. A follow-up Pap smear and co-testing in one year was recommended. Per patient has history of one other abnormal Pap smear in 2016 that a repeat Pap smear was completed for follow up that normal. Last Pap smear result is in EPIC.  Physical exam: Breasts Breasts symmetrical. No skin abnormalities bilateral breasts. No nipple retraction bilateral breasts. No nipple discharge bilateral breasts. No lymphadenopathy. No lumps palpated bilateral breasts. No complaints of pain or tenderness on exam. Screening mammogram recommended at age 53 unless clinically indicated prior.    Pelvic/Bimanual   Ext Genitalia No lesions, no swelling and no discharge observed on external genitalia.         Vagina Vagina pink and normal texture. No lesions or discharge observed in vagina.          Cervix Cervix is present. Cervix pink and irregular appearing around os. Cervix friable. No discharge observed.     Uterus Uterus is present and palpable. Uterus in normal position and normal size.        Adnexae Bilateral ovaries present and palpable. No tenderness on palpation.          Rectovaginal No rectal exam completed today since patient had no rectal complaints. No skin abnormalities observed on exam.    Smoking History: Patient has never smoked.  Patient Navigation: Patient education provided. Access to services provided for patient through [facility] program.

## 2016-12-23 NOTE — Patient Instructions (Addendum)
Explained breast self awareness with Deneise Lever. Let patient know that if today's Pap smear is normal that her next Pap smear is due in one year. Informed patient that a screening mammogram is recommended at age 26 unless clinically indicated prior. Vetta Couzens verbalized understanding.  Wynter Isaacs, Kathaleen Maser, RN 10:48 AM

## 2016-12-26 LAB — CYTOLOGY - PAP
Diagnosis: NEGATIVE
HPV: DETECTED — AB

## 2016-12-29 ENCOUNTER — Telehealth (HOSPITAL_COMMUNITY): Payer: Self-pay | Admitting: *Deleted

## 2016-12-29 ENCOUNTER — Telehealth: Payer: Self-pay | Admitting: General Practice

## 2016-12-29 NOTE — Telephone Encounter (Signed)
Patient called and wanting pap smear results. Advised patient pap smear was normal but HPV was positive. Recommendation is pap smear in one year. Patient voiced understanding.

## 2016-12-29 NOTE — Telephone Encounter (Signed)
Patient called and left message stating she had an appt on 10/2 and received her results but she doesn't understand them & requests call back. Per chart review, Stoney Bang called and talked with patient this afternoon.

## 2018-10-04 ENCOUNTER — Encounter (HOSPITAL_COMMUNITY): Payer: Self-pay

## 2018-10-04 ENCOUNTER — Other Ambulatory Visit: Payer: Self-pay

## 2018-10-04 ENCOUNTER — Emergency Department (HOSPITAL_COMMUNITY)
Admission: EM | Admit: 2018-10-04 | Discharge: 2018-10-04 | Disposition: A | Discharge status: Home / Self Care | Payer: Self-pay | Attending: Emergency Medicine | Admitting: Emergency Medicine

## 2018-10-04 DIAGNOSIS — A5901 Trichomonal vulvovaginitis: Secondary | ICD-10-CM | POA: Insufficient documentation

## 2018-10-04 DIAGNOSIS — Z3202 Encounter for pregnancy test, result negative: Secondary | ICD-10-CM | POA: Insufficient documentation

## 2018-10-04 DIAGNOSIS — Z79899 Other long term (current) drug therapy: Secondary | ICD-10-CM | POA: Insufficient documentation

## 2018-10-04 LAB — URINALYSIS, ROUTINE W REFLEX MICROSCOPIC
Bilirubin Urine: NEGATIVE
Glucose, UA: NEGATIVE mg/dL
Ketones, ur: NEGATIVE mg/dL
Leukocytes,Ua: NEGATIVE
Nitrite: NEGATIVE
Protein, ur: NEGATIVE mg/dL
Specific Gravity, Urine: 1.009 (ref 1.005–1.030)
pH: 7 (ref 5.0–8.0)

## 2018-10-04 LAB — CBG MONITORING, ED: Glucose-Capillary: 104 mg/dL — ABNORMAL HIGH (ref 70–99)

## 2018-10-04 LAB — WET PREP, GENITAL
Clue Cells Wet Prep HPF POC: NONE SEEN
Sperm: NONE SEEN
WBC, Wet Prep HPF POC: NONE SEEN
Yeast Wet Prep HPF POC: NONE SEEN

## 2018-10-04 LAB — PREGNANCY, URINE: Preg Test, Ur: NEGATIVE

## 2018-10-04 MED ORDER — AZITHROMYCIN 250 MG PO TABS
1000.0000 mg | ORAL_TABLET | Freq: Once | ORAL | Status: AC
  Administered 2018-10-04: 1000 mg via ORAL
  Filled 2018-10-04: qty 4

## 2018-10-04 MED ORDER — STERILE WATER FOR INJECTION IJ SOLN
INTRAMUSCULAR | Status: AC
  Administered 2018-10-04: 10 mL
  Filled 2018-10-04: qty 10

## 2018-10-04 MED ORDER — METRONIDAZOLE 500 MG PO TABS: 500.0000 mg | ORAL_TABLET | Freq: Two times a day (BID) | ORAL | 0 refills | Status: DC

## 2018-10-04 MED ORDER — CEFTRIAXONE SODIUM 250 MG IJ SOLR
250.0000 mg | Freq: Once | INTRAMUSCULAR | Status: AC
  Administered 2018-10-04: 250 mg via INTRAMUSCULAR
  Filled 2018-10-04: qty 250

## 2018-10-04 NOTE — Discharge Instructions (Addendum)
You were seen in the emergency department today for vaginal discharge.  Your wet prep showed findings consistent with trichomonas, we are treating this with Flagyl, an antibiotic, please take this as prescribed.  Do not consume alcohol while taking this medicine but extremely dangerous.  We have prescribed you new medication(s) today. Discuss the medications prescribed today with your pharmacist as they can have adverse effects and interactions with your other medicines including over the counter and prescribed medications. Seek medical evaluation if you start to experience new or abnormal symptoms after taking one of these medicines, seek care immediately if you start to experience difficulty breathing, feeling of your throat closing, facial swelling, or rash as these could be indications of a more serious allergic reaction  You tested you for gonorrhea and chlamydia, we have treated you for these conditions prophylactically in the emergency department.  To inform all sexual partners of positive STD results.  Please use protection when sexually active.  Please do not have intercourse until 10 days following last dose of antibiotics.  Follow-up with the health department or primary care within 1 week.  Return to the ER for new or worsening symptoms or any other concerns.

## 2018-10-04 NOTE — ED Provider Notes (Signed)
[facility]-EMERGENCY DEPT Provider Note   CSN: [id number] Arrival date & time: 10/04/18  1255     History   Chief Complaint Chief Complaint  Patient presents with  . SEXUALLY TRANSMITTED DISEASE    HPI Lauren Aguilar is a 28 y.o. female without significant past medical history who presents to the emergency department with complaints of vaginal discharge/irritation for the past 1 week.  States she is having white discharge that is pruritic without alleviating or aggravating factors.  She states that she was recently checked for STDs in May 2020, she was found to have trichomonas and was treated accordingly.  She was also covered for gonorrhea/chlamydia with antibiotics in the department.  She states none of her additional test came back positive.  She states that her symptoms have pretty much resolved until 1 week ago when she started to have discharge again.  She has not had intercourse since her visit in May 2020.  Denies fever, chills, nausea, vomiting, or pelvic pain.  She is currently menstruating.  She also wanted her blood sugar checked while she was here.     HPI  History reviewed. No pertinent past medical history.  Patient Active Problem List   Diagnosis Date Noted  . 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 No pertinent surgical history.   OB History    Gravida  0   Para  0   Term  0   Preterm  0   AB  0   Living  0     SAB  0   TAB  0   Ectopic  0   Multiple  0   Live Births  0            Home Medications    Prior to Admission medications   Medication Sig Start Date End Date Taking? Authorizing Provider  norethindrone-ethinyl estradiol (BREVICON, 28,) 0.5-35 MG-MCG tablet Take 1 tablet by mouth daily.    [provider]  Prenatal Vit-Fe Fumarate-FA (PRENATAL VITAMIN PO) Take 1 tablet by mouth daily.    [provider]    Family History Family History  Problem  Relation Age of Onset  . Diabetes Father   . Hypertension Father   . Breast cancer Paternal Grandmother     Social History Social History   Tobacco Use  . Smoking status: Never Smoker  . Smokeless tobacco: Never Used  Substance Use Topics  . Alcohol use: No  . Drug use: No     Allergies   Amoxicillin   Review of Systems Review of Systems  Constitutional: Negative for chills and fever.  Gastrointestinal: Negative for abdominal pain, constipation, diarrhea, nausea and vomiting.  Genitourinary: Positive for vaginal bleeding (Menstruation.) and vaginal discharge. Negative for dysuria and pelvic pain.  All other systems reviewed and are negative.    Physical Exam Updated Vital Signs BP 125/69 (BP Location: Left Arm)   Pulse 71   Temp 98.9 F (37.2 C) (Oral)   Resp 14   Ht 5\' 4"  (1.626 m)   Wt 89.9 kg   LMP 10/04/2018   SpO2 100%   BMI 34.00 kg/m   Physical Exam Vitals signs and nursing note reviewed. Exam conducted with a chaperone present.  Constitutional:      General: She is not in acute distress.    Appearance: She is well-developed. She is not toxic-appearing.  HENT:     Head: Normocephalic and atraumatic.  Eyes:  General:        Right eye: No discharge.        Left eye: No discharge.     Conjunctiva/sclera: Conjunctivae normal.  Neck:     Musculoskeletal: Neck supple.  Cardiovascular:     Rate and Rhythm: Normal rate and regular rhythm.  Pulmonary:     Effort: Pulmonary effort is normal. No respiratory distress.     Breath sounds: Normal breath sounds. No wheezing, rhonchi or rales.  Abdominal:     General: There is no distension.     Palpations: Abdomen is soft.     Tenderness: There is no abdominal tenderness. There is no guarding or rebound.  Genitourinary:    Labia:        Right: No lesion.        Left: No lesion.      Comments: Carly RN present as Biomedical engineerchaperone. Vaginal bleeding present.  Mild amount of white vaginal discharge. No  cervical friability.  No cervical motion tenderness.  No adnexal tenderness.  No adnexal masses palpated. Skin:    General: Skin is warm and dry.     Findings: No rash.  Neurological:     Mental Status: She is alert.     Comments: Clear speech.   Psychiatric:        Behavior: Behavior normal.     ED Treatments / Results  Labs (all labs ordered are listed, but only abnormal results are displayed) Labs Reviewed  URINALYSIS, ROUTINE W REFLEX MICROSCOPIC - Abnormal; Notable for the following components:      Result Value   Hgb urine dipstick LARGE (*)    Bacteria, UA RARE (*)    All other components within normal limits  CBG MONITORING, ED - Abnormal; Notable for the following components:   Glucose-Capillary 104 (*)    All other components within normal limits  PREGNANCY, URINE    EKG None  Radiology No results found.  Procedures Procedures (including critical care time)  Medications Ordered in ED Medications  cefTRIAXone (ROCEPHIN) injection 250 mg (has no administration in time range)  azithromycin (ZITHROMAX) tablet 1,000 mg (has no administration in time range)     Initial Impression / Assessment and Plan / ED Course  I have reviewed the triage vital signs and the nursing notes.  Pertinent labs & imaging results that were available during my care of the patient were reviewed by me and considered in my medical decision making (see chart for details).   Patient presents with complaints of vaginal discharge that is pruritic for the past 1 week.Patient nontoxic-appearing, no apparent distress, vitals WNL. Pregnancy test negative. Urinalysis: Hematuria consistent with menstruation, no UTI. No cervical motion tenderness or adnexal tenderness, exam not consistent with PID. Wet prep consistent w/ trichomoniasis---> will tx with 1 week of flagyl given she had one time dose @ prior ER and states she did not have intercourse since then. Discussed no EtOH with this medicine.   Gonorrhea and chlamydia swabs obtained & pending, patient opted for prophylaxis in the ED which was administered.   Discussed need to inform all sexual partners as well as need for abstinence for 10 days upon completion of antibiotics.  Discussed importance of protection when sexually active.  Health department follow-up. I discussed results, treatment plan, need for follow-up, and return precautions with the patient. Provided opportunity for questions, patient confirmed understanding and is in agreement with plan.    Final Clinical Impressions(s) / ED Diagnoses   Final diagnoses:  Trichomonal vaginitis    ED Discharge Orders         Ordered    metroNIDAZOLE (FLAGYL) 500 MG tablet  2 times daily     10/04/18 2044           Leafy Kindle 10/04/18 2049    Jola Schmidt, MD 10/04/18 2250

## 2018-10-04 NOTE — ED Triage Notes (Signed)
Pt states that she wanted to follow up on STD testing and diabetes. Pt states she was already seen for STD test in [place]

## 2018-10-05 LAB — GC/CHLAMYDIA PROBE AMP (~~LOC~~) NOT AT ARMC
Chlamydia: NEGATIVE
Neisseria Gonorrhea: NEGATIVE

## 2018-12-01 ENCOUNTER — Emergency Department (HOSPITAL_COMMUNITY): Payer: No Typology Code available for payment source

## 2018-12-01 ENCOUNTER — Inpatient Hospital Stay (HOSPITAL_COMMUNITY): Payer: No Typology Code available for payment source

## 2018-12-01 ENCOUNTER — Inpatient Hospital Stay (HOSPITAL_COMMUNITY)
Admission: EM | Admit: 2018-12-01 | Discharge: 2018-12-03 | DRG: 964 | Disposition: A | Discharge status: Home / Self Care | Payer: No Typology Code available for payment source | Attending: General Surgery | Admitting: General Surgery

## 2018-12-01 ENCOUNTER — Encounter (HOSPITAL_COMMUNITY): Payer: Self-pay | Admitting: General Practice

## 2018-12-01 ENCOUNTER — Other Ambulatory Visit: Payer: Self-pay

## 2018-12-01 DIAGNOSIS — S32402A Unspecified fracture of left acetabulum, initial encounter for closed fracture: Principal | ICD-10-CM | POA: Diagnosis present

## 2018-12-01 DIAGNOSIS — S72002A Fracture of unspecified part of neck of left femur, initial encounter for closed fracture: Secondary | ICD-10-CM

## 2018-12-01 DIAGNOSIS — S270XXA Traumatic pneumothorax, initial encounter: Secondary | ICD-10-CM | POA: Diagnosis present

## 2018-12-01 DIAGNOSIS — Z20828 Contact with and (suspected) exposure to other viral communicable diseases: Secondary | ICD-10-CM | POA: Diagnosis present

## 2018-12-01 DIAGNOSIS — J939 Pneumothorax, unspecified: Secondary | ICD-10-CM

## 2018-12-01 DIAGNOSIS — Y92410 Unspecified street and highway as the place of occurrence of the external cause: Secondary | ICD-10-CM | POA: Diagnosis not present

## 2018-12-01 DIAGNOSIS — M25572 Pain in left ankle and joints of left foot: Secondary | ICD-10-CM | POA: Diagnosis present

## 2018-12-01 DIAGNOSIS — T1490XA Injury, unspecified, initial encounter: Secondary | ICD-10-CM | POA: Diagnosis present

## 2018-12-01 DIAGNOSIS — S2242XA Multiple fractures of ribs, left side, initial encounter for closed fracture: Secondary | ICD-10-CM | POA: Diagnosis present

## 2018-12-01 DIAGNOSIS — S022XXA Fracture of nasal bones, initial encounter for closed fracture: Secondary | ICD-10-CM | POA: Diagnosis present

## 2018-12-01 DIAGNOSIS — Z881 Allergy status to other antibiotic agents status: Secondary | ICD-10-CM

## 2018-12-01 DIAGNOSIS — S32425A Nondisplaced fracture of posterior wall of left acetabulum, initial encounter for closed fracture: Secondary | ICD-10-CM

## 2018-12-01 LAB — SAMPLE TO BLOOD BANK

## 2018-12-01 LAB — BASIC METABOLIC PANEL
Anion gap: 11 (ref 5–15)
BUN: 8 mg/dL (ref 6–20)
CO2: 22 mmol/L (ref 22–32)
Calcium: 8.3 mg/dL — ABNORMAL LOW (ref 8.9–10.3)
Chloride: 107 mmol/L (ref 98–111)
Creatinine, Ser: 0.75 mg/dL (ref 0.44–1.00)
GFR calc Af Amer: >60 mL/min (ref >60)
GFR calc non Af Amer: >60 mL/min (ref >60)
Glucose, Bld: 136 mg/dL — ABNORMAL HIGH (ref 70–99)
Potassium: 3.7 mmol/L (ref 3.5–5.1)
Sodium: 140 mmol/L (ref 135–145)

## 2018-12-01 LAB — COMPREHENSIVE METABOLIC PANEL
ALT: 82 U/L — ABNORMAL HIGH (ref 0–44)
AST: 124 U/L — ABNORMAL HIGH (ref 15–41)
Albumin: 3.9 g/dL (ref 3.5–5.0)
Alkaline Phosphatase: 55 U/L (ref 38–126)
Anion gap: 13 (ref 5–15)
BUN: 9 mg/dL (ref 6–20)
CO2: 19 mmol/L — ABNORMAL LOW (ref 22–32)
Calcium: 8.7 mg/dL — ABNORMAL LOW (ref 8.9–10.3)
Chloride: 106 mmol/L (ref 98–111)
Creatinine, Ser: 0.92 mg/dL (ref 0.44–1.00)
GFR calc Af Amer: >60 mL/min (ref >60)
GFR calc non Af Amer: >60 mL/min (ref >60)
Glucose, Bld: 159 mg/dL — ABNORMAL HIGH (ref 70–99)
Potassium: 3.4 mmol/L — ABNORMAL LOW (ref 3.5–5.1)
Sodium: 138 mmol/L (ref 135–145)
Total Bilirubin: 0.4 mg/dL (ref 0.3–1.2)
Total Protein: 6.9 g/dL (ref 6.5–8.1)

## 2018-12-01 LAB — RAPID URINE DRUG SCREEN, HOSP PERFORMED
Amphetamines: NOT DETECTED
Barbiturates: NOT DETECTED
Benzodiazepines: NOT DETECTED
Cocaine: NOT DETECTED
Opiates: NOT DETECTED
Tetrahydrocannabinol: NOT DETECTED

## 2018-12-01 LAB — CBC
HCT: 35.4 % — ABNORMAL LOW (ref 36.0–46.0)
HCT: 36.3 % (ref 36.0–46.0)
Hemoglobin: 11 g/dL — ABNORMAL LOW (ref 12.0–15.0)
Hemoglobin: 11.3 g/dL — ABNORMAL LOW (ref 12.0–15.0)
MCH: 24.7 pg — ABNORMAL LOW (ref 26.0–34.0)
MCH: 25.8 pg — ABNORMAL LOW (ref 26.0–34.0)
MCHC: 30.3 g/dL (ref 30.0–36.0)
MCHC: 31.9 g/dL (ref 30.0–36.0)
MCV: 80.8 fL (ref 80.0–100.0)
MCV: 81.4 fL (ref 80.0–100.0)
Platelets: 329 10*3/uL (ref 150–400)
Platelets: 345 10*3/uL (ref 150–400)
RBC: 4.38 MIL/uL (ref 3.87–5.11)
RBC: 4.46 MIL/uL (ref 3.87–5.11)
RDW: 14.7 % (ref 11.5–15.5)
RDW: 14.8 % (ref 11.5–15.5)
WBC: 14.3 10*3/uL — ABNORMAL HIGH (ref 4.0–10.5)
WBC: 16.1 10*3/uL — ABNORMAL HIGH (ref 4.0–10.5)
nRBC: 0 % (ref 0.0–0.2)
nRBC: 0 % (ref 0.0–0.2)

## 2018-12-01 LAB — HIV ANTIBODY (ROUTINE TESTING W REFLEX): HIV Screen 4th Generation wRfx: NONREACTIVE

## 2018-12-01 LAB — CDS SEROLOGY

## 2018-12-01 LAB — I-STAT BETA HCG BLOOD, ED (MC, WL, AP ONLY): I-stat hCG, quantitative: <5 m[IU]/mL (ref <5)

## 2018-12-01 LAB — PROTIME-INR
INR: 1.1 (ref 0.8–1.2)
Prothrombin Time: 14.5 seconds (ref 11.4–15.2)

## 2018-12-01 LAB — SARS CORONAVIRUS 2 BY RT PCR (HOSPITAL ORDER, PERFORMED IN ~~LOC~~ HOSPITAL LAB): SARS Coronavirus 2: NEGATIVE

## 2018-12-01 LAB — ETHANOL: Alcohol, Ethyl (B): 241 mg/dL — ABNORMAL HIGH (ref <10)

## 2018-12-01 MED ORDER — IOHEXOL 300 MG/ML  SOLN
100.0000 mL | Freq: Once | INTRAMUSCULAR | Status: AC | PRN
  Administered 2018-12-01: 100 mL via INTRAVENOUS

## 2018-12-01 MED ORDER — OXYCODONE HCL 5 MG PO TABS
5.0000 mg | ORAL_TABLET | ORAL | Status: DC | PRN
  Administered 2018-12-01 – 2018-12-02 (×2): 5 mg via ORAL
  Filled 2018-12-01 (×3): qty 1
  Filled 2018-12-01: qty 2

## 2018-12-01 MED ORDER — PROMETHAZINE HCL 25 MG/ML IJ SOLN
12.5000 mg | Freq: Four times a day (QID) | INTRAMUSCULAR | Status: DC | PRN
  Administered 2018-12-01 – 2018-12-02 (×2): 12.5 mg via INTRAVENOUS
  Filled 2018-12-01 (×2): qty 1

## 2018-12-01 MED ORDER — ONDANSETRON HCL 4 MG/2ML IJ SOLN
4.0000 mg | Freq: Four times a day (QID) | INTRAMUSCULAR | Status: DC | PRN
  Administered 2018-12-01: 09:00:00 4 mg via INTRAVENOUS
  Filled 2018-12-01: qty 2

## 2018-12-01 MED ORDER — SODIUM CHLORIDE 0.9 % IV BOLUS
1000.0000 mL | Freq: Once | INTRAVENOUS | Status: AC
  Administered 2018-12-01: 1000 mL via INTRAVENOUS

## 2018-12-01 MED ORDER — DEXTROSE-NACL 5-0.9 % IV SOLN
INTRAVENOUS | Status: DC
  Administered 2018-12-01 – 2018-12-02 (×3): via INTRAVENOUS

## 2018-12-01 MED ORDER — ONDANSETRON 4 MG PO TBDP: 4.0000 mg | ORAL_TABLET | Freq: Four times a day (QID) | ORAL | Status: DC | PRN

## 2018-12-01 MED ORDER — ACETAMINOPHEN 325 MG PO TABS
650.0000 mg | ORAL_TABLET | Freq: Four times a day (QID) | ORAL | Status: DC
  Administered 2018-12-01 – 2018-12-02 (×4): 650 mg via ORAL
  Filled 2018-12-01 (×4): qty 2

## 2018-12-01 MED ORDER — TETANUS-DIPHTH-ACELL PERTUSSIS 5-2.5-18.5 LF-MCG/0.5 IM SUSP
0.5000 mL | Freq: Once | INTRAMUSCULAR | Status: AC
  Administered 2018-12-01: 0.5 mL via INTRAMUSCULAR
  Filled 2018-12-01: qty 0.5

## 2018-12-01 MED ORDER — HYDROMORPHONE HCL 1 MG/ML IJ SOLN
1.0000 mg | INTRAMUSCULAR | Status: DC | PRN
  Administered 2018-12-01: 11:00:00 1 mg via INTRAVENOUS
  Filled 2018-12-01: qty 1
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
## 2018-12-01 NOTE — Progress Notes (Signed)
Checked on patient. CXR stable without PTX. Ortho to see, updated patient that we will start PO diet if ortho not planning surgery today. Notified patient that she is not to bear weight on LLE unless otherwise told by orthopedic surgery. Patient verbalized understanding.  Brigid Re , [facility] 12/01/2018, 8:13 AM Pager: [phone]
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
## 2018-12-01 NOTE — Progress Notes (Signed)
Received pt at 1630 from [facility] via bed. A&O x4, small abrasions noted to face, left shoulder and right knee. C/o left ribcage pain.
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
## 2018-12-02 MED ORDER — ALUM & MAG HYDROXIDE-SIMETH 200-200-20 MG/5ML PO SUSP
30.0000 mL | Freq: Once | ORAL | Status: AC
  Administered 2018-12-02: 22:00:00 30 mL via ORAL
  Filled 2018-12-02: qty 30

## 2018-12-02 MED ORDER — CALCIUM CARBONATE ANTACID 500 MG PO CHEW
1.0000 | CHEWABLE_TABLET | Freq: Three times a day (TID) | ORAL | Status: DC | PRN
  Administered 2018-12-02: 23:00:00 200 mg via ORAL
  Filled 2018-12-02: qty 1

## 2018-12-02 MED ORDER — HYDROMORPHONE HCL 1 MG/ML IJ SOLN: 1.0000 mg | INTRAMUSCULAR | Status: DC | PRN

## 2018-12-02 MED ORDER — ENOXAPARIN SODIUM 40 MG/0.4ML ~~LOC~~ SOLN
40.0000 mg | SUBCUTANEOUS | Status: DC
  Administered 2018-12-02 – 2018-12-03 (×2): 40 mg via SUBCUTANEOUS
  Filled 2018-12-02 (×2): qty 0.4

## 2018-12-02 MED ORDER — IBUPROFEN 200 MG PO TABS
600.0000 mg | ORAL_TABLET | Freq: Three times a day (TID) | ORAL | Status: DC
  Administered 2018-12-02 – 2018-12-03 (×4): 600 mg via ORAL
  Filled 2018-12-02 (×4): qty 3

## 2018-12-02 MED ORDER — PANTOPRAZOLE SODIUM 40 MG PO TBEC
40.0000 mg | DELAYED_RELEASE_TABLET | Freq: Every day | ORAL | Status: DC
  Administered 2018-12-03: 40 mg via ORAL
  Filled 2018-12-02 (×2): qty 1

## 2018-12-02 MED ORDER — ACETAMINOPHEN 500 MG PO TABS
1000.0000 mg | ORAL_TABLET | Freq: Four times a day (QID) | ORAL | Status: DC
  Administered 2018-12-02 – 2018-12-03 (×4): 1000 mg via ORAL
  Filled 2018-12-02 (×4): qty 2

## 2018-12-02 MED ORDER — GABAPENTIN 100 MG PO CAPS
200.0000 mg | ORAL_CAPSULE | Freq: Two times a day (BID) | ORAL | Status: DC
  Administered 2018-12-02 – 2018-12-03 (×3): 200 mg via ORAL
  Filled 2018-12-02 (×3): qty 2

## 2018-12-02 MED ORDER — METHOCARBAMOL 500 MG PO TABS
500.0000 mg | ORAL_TABLET | Freq: Three times a day (TID) | ORAL | Status: DC
  Administered 2018-12-02 – 2018-12-03 (×4): 500 mg via ORAL
  Filled 2018-12-02 (×4): qty 1
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
## 2018-12-02 NOTE — Plan of Care (Signed)
  Problem: Clinical Measurements: Goal: Respiratory complications will improve Outcome: Progressing   Problem: Activity: Goal: Risk for activity intolerance will decrease Outcome: Progressing   Problem: Elimination: Goal: Will not experience complications related to bowel motility Outcome: Progressing Goal: Will not experience complications related to urinary retention Outcome: Progressing   Problem: Pain Managment: Goal: General experience of comfort will improve Outcome: Progressing   Problem: Safety: Goal: Ability to remain free from injury will improve Outcome: Progressing   Problem: Skin Integrity: Goal: Risk for impaired skin integrity will decrease Outcome: Progressing   
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
## 2018-12-03 MED ORDER — ACETAMINOPHEN 500 MG PO TABS: 1000.0000 mg | ORAL_TABLET | Freq: Four times a day (QID) | ORAL | 0 refills | Status: DC

## 2018-12-03 MED ORDER — METHOCARBAMOL 500 MG PO TABS: 500.0000 mg | ORAL_TABLET | Freq: Three times a day (TID) | ORAL | 0 refills | Status: DC

## 2018-12-03 MED ORDER — OXYCODONE HCL 5 MG PO TABS: 5.0000 mg | ORAL_TABLET | ORAL | 0 refills | Status: DC | PRN

## 2018-12-03 MED ORDER — GABAPENTIN 100 MG PO CAPS: 200.0000 mg | ORAL_CAPSULE | Freq: Two times a day (BID) | ORAL | 0 refills | Status: DC

## 2018-12-03 MED ORDER — IBUPROFEN 600 MG PO TABS: 600.0000 mg | ORAL_TABLET | Freq: Three times a day (TID) | ORAL | 0 refills | Status: DC | PRN

## 2018-12-03 MED FILL — GABAPENTIN 100 MG CAPSULE: 100 | 7 days supply | Qty: 30 | Fill #0

## 2018-12-03 MED FILL — oxyCODONE HCL 5 MG TABS: 5 | 3 days supply | Qty: 30 | Fill #0

## 2018-12-03 MED FILL — METHOCARBAMOL 500 MG TABS: 500 | 30 days supply | Qty: 30 | Fill #0
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
## 2018-12-03 NOTE — Discharge Instructions (Signed)
Partial weightbaring to left lower extremity for 2 weeks.  Follow up with Dr. August Saucerean at that time for further evaluation and recommendations  Follow up with Dr. Kenney Housemanrab for nasal bone fracture.  Follow up with PCP as needed for rib fractures.   Nasal Fracture A fracture is a break in a bone. A nasal fracture is a broken nose. Minor breaks do not need treatment. They often heal on their own in about one month. Serious breaks may need treatment. Sometimes surgery is needed. What are the causes? This condition is usually caused by a direct hit to the nose (blunt injury). This often occurs from:  Playing a contact sport.  Being in a car accident.  Falling.  Getting punched. What are the signs or symptoms?  Pain.  Swelling of the nose.  Bleeding from the nose.  Bruising around the nose or bruising around the eyes (black eyes).  The nose having a crooked shape. How is this treated? Treatment depends on how bad the injury is.  Minor breaks often do not need treatment.  For more serious breaks that have caused bones to move out of position, treatment may involve one of these: ? Moving the bones back into position without surgery. Your doctor may be able to do this in his or her office after you are given medicine to numb the nose area (local anesthetic). ? Surgery. If needed, this will be done after the swelling is gone. Follow these instructions at home: Activity  Return to your normal activities as told by your doctor. Ask your doctor what activities are safe for you.  Do not play contact sports for 3-4 weeks or as told by your doctor. General instructions      If told, put ice on the injured area: ? Put ice in a plastic bag. ? Place a towel between your skin and the bag. ? Leave the ice on for 20 minutes, 2-3 times a day.  Take over-the-counter and prescription medicines only as told by your doctor.  If your nose bleeds, sit up while you gently squeeze your nose shut for  10 minutes.  Try to not blow your nose.  Keep all follow-up visits as told by your doctor. This is important. Contact a doctor if:  You have more pain or very bad pain.  You keep having nosebleeds.  The shape of your nose does not return to normal after 5 days.  You have pus coming out of your nose. Get help right away if:  Your nose bleeds for more than 20 minutes.  You have clear fluid draining out of your nose.  You have a swelling on the inside of your nose that does not get better.  You have trouble moving your eyes.  You keep throwing up (vomiting). Summary  A nasal fracture is a broken nose.  Symptoms include pain, swelling, and bruising.  Minor breaks often do not require treatment. More serious breaks may require surgery or other treatments.  If your nose bleeds, sit up while you gently squeeze your nose shut for 10 minutes. This information is not intended to replace advice given to you by your health care provider. Make sure you discuss any questions you have with your health care provider. Document Released: 12/11/2007 Document Revised: 08/04/2017 Document Reviewed: 08/04/2017 Elsevier Patient Education  2020 [employer]   Rib Fracture  A rib fracture is a break or crack in one of the bones of the ribs. The ribs are like a cage  that goes around your upper chest. A broken or cracked rib is often painful, but most do not cause other problems. Most rib fractures usually heal on their own in 1-3 months. Follow these instructions at home: Managing pain, stiffness, and swelling  If directed, apply ice to the injured area. ? Put ice in a plastic bag. ? Place a towel between your skin and the bag. ? Leave the ice on for 20 minutes, 2-3 times a day.  Take over-the-counter and prescription medicines only as told by your doctor. Activity  Avoid activities that cause pain to the injured area. Protect your injured area.  Slowly increase activity as told by  your doctor. General instructions  Do deep breathing as told by your doctor. You may be told to: ? Take deep breaths many times a day. ? Cough many times a day while hugging a pillow. ? Use a device (incentive spirometer) to do deep breathing many times a day.  Drink enough fluid to keep your pee (urine) clear or pale yellow.  Do not wear a rib belt or binder. These do not allow you to breathe deeply.  Keep all follow-up visits as told by your doctor. This is important. Contact a doctor if:  You have a fever. Get help right away if:  You have trouble breathing.  You are short of breath.  You cannot stop coughing.  You cough up thick or bloody spit (sputum).  You feel sick to your stomach (nauseous), throw up (vomit), or have belly (abdominal) pain.  Your pain gets worse and medicine does not help. Summary  A rib fracture is a break or crack in one of the bones of the ribs.  Apply ice to the injured area and take medicines for pain as told by your doctor.  Take deep breaths and cough many times a day. Hug a pillow every time you cough. This information is not intended to replace advice given to you by your health care provider. Make sure you discuss any questions you have with your health care provider. Document Released: 12/11/2007 Document Revised: 02/13/2017 Document Reviewed: 06/03/2016 Elsevier Patient Education  2020 [employer].
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
## 2018-12-03 NOTE — TOC Transition Note (Addendum)
Transition of Care [facility]) - CM/SW Discharge Note   Patient Details  Name: Lauren Aguilar MRN: [id number] Date of Birth: Sep 13, 1990  [facility] CM/SW Contact:  Ella Bodo, RN Phone Number: 12/03/2018, 10:15 AM   Clinical Narrative:   Pt medically stable for dc home today with mother.  DME delivered to room.  Pt uninsured, and is eligible for medication assistance through [facility] program.  DC Rx sent to [place] to be filled using [place] letter.  Pt screened for charity [facility] through [facility], but pt does not meet income qualifications for charity services.  She is agreeable to OP services for PT; will refer to [place].  Rehab center will call pt for an appointment; pt states that her mother can bring her to appointments.   CSW to see pt for SBIRT/ ETOH resources.    Final next level of care: OP Rehab Barriers to Discharge: Continued Medical Work up   Patient Goals and CMS Choice   CMS Medicare.gov Compare Post Acute Care list provided to:: Patient Represenative (must comment)(mother) Choice offered to / list presented to : Parent                         Discharge Plan and Services   Discharge Planning Services: CM Consult Post Acute Care Choice: Home Health          DME Arranged: 3-N-1, Walker rolling DME Agency: [employer] Date DME Agency Contacted: 12/02/18 Time DME Agency Contacted: [phone] Representative spoke with at DME Agency: Andree Coss  Readmission Risk Interventions Readmission Risk Prevention Plan 12/03/2018  [place] Appt Complete  Medication Screening Complete  Transportation Screening Complete    Reinaldo Raddle, RN, BSN  Trauma/Neuro ICU Case Manager [phone]
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
## 2018-12-06 ENCOUNTER — Encounter (HOSPITAL_COMMUNITY): Payer: Self-pay

## 2018-12-15 ENCOUNTER — Encounter: Payer: Self-pay | Admitting: Orthopedic Surgery

## 2018-12-15 ENCOUNTER — Ambulatory Visit (INDEPENDENT_AMBULATORY_CARE_PROVIDER_SITE_OTHER): Payer: Self-pay | Admitting: Orthopedic Surgery

## 2018-12-15 ENCOUNTER — Other Ambulatory Visit: Payer: Self-pay

## 2018-12-15 ENCOUNTER — Ambulatory Visit: Payer: Self-pay | Attending: Family Medicine | Admitting: Family Medicine

## 2018-12-15 ENCOUNTER — Encounter: Payer: Self-pay | Admitting: Family Medicine

## 2018-12-15 VITALS — BP 104/69 | HR 82 | Temp 98.0°F | Ht 64.0 in | Wt 192.0 lb

## 2018-12-15 DIAGNOSIS — S32402A Unspecified fracture of left acetabulum, initial encounter for closed fracture: Secondary | ICD-10-CM

## 2018-12-15 DIAGNOSIS — S2242XA Multiple fractures of ribs, left side, initial encounter for closed fracture: Secondary | ICD-10-CM

## 2018-12-15 DIAGNOSIS — S32422D Displaced fracture of posterior wall of left acetabulum, subsequent encounter for fracture with routine healing: Secondary | ICD-10-CM

## 2018-12-15 MED ORDER — METHOCARBAMOL 500 MG PO TABS: 500.0000 mg | ORAL_TABLET | Freq: Three times a day (TID) | ORAL | 0 refills | Status: DC

## 2018-12-15 MED ORDER — GABAPENTIN 100 MG PO CAPS: 200.0000 mg | ORAL_CAPSULE | Freq: Two times a day (BID) | ORAL | 1 refills | Status: DC

## 2018-12-15 MED ORDER — METHOCARBAMOL 500 MG PO TABS: 500.0000 mg | ORAL_TABLET | Freq: Three times a day (TID) | ORAL | 1 refills | Status: DC

## 2018-12-15 MED ORDER — GABAPENTIN 100 MG PO CAPS: 200.0000 mg | ORAL_CAPSULE | Freq: Two times a day (BID) | ORAL | 0 refills | Status: DC

## 2018-12-15 MED FILL — METHOCARBAMOL 500 MG TABS: 500 | 20 days supply | Qty: 60 | Fill #0

## 2018-12-15 MED FILL — GABAPENTIN 100 MG CAPSULE: 100 | 30 days supply | Qty: 120 | Fill #0

## 2018-12-15 NOTE — Patient Instructions (Signed)
Rib Fracture ° °A rib fracture is a break or crack in one of the bones of the ribs. The ribs are like a cage that goes around your upper chest. A broken or cracked rib is often painful, but most do not cause other problems. Most rib fractures usually heal on their own in 1-3 months. °Follow these instructions at home: °Managing pain, stiffness, and swelling °· If directed, apply ice to the injured area. °? Put ice in a plastic bag. °? Place a towel between your skin and the bag. °? Leave the ice on for 20 minutes, 2-3 times a day. °· Take over-the-counter and prescription medicines only as told by your doctor. °Activity °· Avoid activities that cause pain to the injured area. Protect your injured area. °· Slowly increase activity as told by your doctor. °General instructions °· Do deep breathing as told by your doctor. You may be told to: °? Take deep breaths many times a day. °? Cough many times a day while hugging a pillow. °? Use a device (incentive spirometer) to do deep breathing many times a day. °· Drink enough fluid to keep your pee (urine) clear or pale yellow. °· Do not wear a rib belt or binder. These do not allow you to breathe deeply. °· Keep all follow-up visits as told by your doctor. This is important. °Contact a doctor if: °· You have a fever. °Get help right away if: °· You have trouble breathing. °· You are short of breath. °· You cannot stop coughing. °· You cough up thick or bloody spit (sputum). °· You feel sick to your stomach (nauseous), throw up (vomit), or have belly (abdominal) pain. °· Your pain gets worse and medicine does not help. °Summary °· A rib fracture is a break or crack in one of the bones of the ribs. °· Apply ice to the injured area and take medicines for pain as told by your doctor. °· Take deep breaths and cough many times a day. Hug a pillow every time you cough. °This information is not intended to replace advice given to you by your health care provider. Make sure you  discuss any questions you have with your health care provider. °Document Released: 12/11/2007 Document Revised: 02/13/2017 Document Reviewed: 06/03/2016 °Elsevier Patient Education © 2020 [employer] ° °

## 2018-12-15 NOTE — Progress Notes (Signed)
Patient needs refills on medications.  Patient is having pain in back and legs, patient states that her legs feel numb, all pain is located on left side.
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
## 2018-12-17 ENCOUNTER — Encounter: Payer: Self-pay | Admitting: Orthopedic Surgery
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
## 2019-01-12 ENCOUNTER — Ambulatory Visit: Payer: Self-pay | Admitting: Orthopedic Surgery

## 2019-01-26 ENCOUNTER — Ambulatory Visit (INDEPENDENT_AMBULATORY_CARE_PROVIDER_SITE_OTHER): Payer: Self-pay | Admitting: Orthopedic Surgery

## 2019-01-26 ENCOUNTER — Ambulatory Visit (INDEPENDENT_AMBULATORY_CARE_PROVIDER_SITE_OTHER): Payer: Self-pay

## 2019-01-26 ENCOUNTER — Other Ambulatory Visit: Payer: Self-pay

## 2019-01-26 DIAGNOSIS — S32422D Displaced fracture of posterior wall of left acetabulum, subsequent encounter for fracture with routine healing: Secondary | ICD-10-CM

## 2019-01-26 DIAGNOSIS — M79605 Pain in left leg: Secondary | ICD-10-CM

## 2019-01-26 MED ORDER — DICLOFENAC SODIUM 50 MG PO TBEC: 50.0000 mg | DELAYED_RELEASE_TABLET | Freq: Two times a day (BID) | ORAL | 0 refills | Status: DC

## 2019-01-26 MED FILL — DICLOFENAC SOD EC 50 MG TAB: 50 | 30 days supply | Qty: 60 | Fill #0

## 2019-01-28 ENCOUNTER — Encounter: Payer: Self-pay | Admitting: Orthopedic Surgery
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
## 2019-01-31 ENCOUNTER — Telehealth: Payer: Self-pay | Admitting: Orthopedic Surgery

## 2019-01-31 NOTE — Telephone Encounter (Signed)
Patient called. Says she needs a work note stating how much longer she will be out of work. Her call back number is [phone]

## 2019-01-31 NOTE — Telephone Encounter (Signed)
Please advise. Thanks.  

## 2019-02-02 ENCOUNTER — Encounter: Payer: Self-pay | Admitting: Physical Therapy

## 2019-02-02 ENCOUNTER — Ambulatory Visit (INDEPENDENT_AMBULATORY_CARE_PROVIDER_SITE_OTHER): Payer: Self-pay | Admitting: Physical Therapy

## 2019-02-02 ENCOUNTER — Other Ambulatory Visit: Payer: Self-pay

## 2019-02-02 DIAGNOSIS — M25562 Pain in left knee: Secondary | ICD-10-CM

## 2019-02-02 DIAGNOSIS — M6281 Muscle weakness (generalized): Secondary | ICD-10-CM

## 2019-02-02 DIAGNOSIS — R2689 Other abnormalities of gait and mobility: Secondary | ICD-10-CM

## 2019-02-02 DIAGNOSIS — M25552 Pain in left hip: Secondary | ICD-10-CM

## 2019-02-02 NOTE — Patient Instructions (Signed)
Access Code: [email or web address]  URL: [email or web address]  Date: 02/02/2019  Prepared by: Elsie Ra   Exercises  Supine Hamstring Stretch with Strap - 3 reps - 1 sets - 30 hold - 2x daily - 6x weekly  Prone Quadriceps Stretch with Strap - 3 reps - 1 sets - 30 hold - 2x daily - 6x weekly  Sidelying Hip Abduction - 10 reps - 1-3 sets - 2x daily - 6x weekly  Supine Active Straight Leg Raise - 10 reps - 1-3 sets - 2x daily - 6x weekly  Seated Long Arc Quad - 10 reps - 2-3 sets - 2x daily - 6x weekly  Sit to Stand - 10 reps - 1-2 sets - 2x daily - 6x weekly
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
## 2019-02-02 NOTE — Telephone Encounter (Signed)
Done this morning per Dr Marlou Sa to keep her out of work for 4 more weeks.

## 2019-02-16 ENCOUNTER — Encounter: Payer: Self-pay | Admitting: Physical Therapy

## 2019-02-23 ENCOUNTER — Ambulatory Visit (INDEPENDENT_AMBULATORY_CARE_PROVIDER_SITE_OTHER): Payer: Self-pay | Admitting: Orthopedic Surgery

## 2019-02-23 ENCOUNTER — Other Ambulatory Visit: Payer: Self-pay

## 2019-02-23 DIAGNOSIS — S32422D Displaced fracture of posterior wall of left acetabulum, subsequent encounter for fracture with routine healing: Secondary | ICD-10-CM

## 2019-02-26 ENCOUNTER — Encounter: Payer: Self-pay | Admitting: Orthopedic Surgery
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
## 2019-03-02 ENCOUNTER — Encounter: Payer: Self-pay | Admitting: Physical Therapy

## 2019-03-04 ENCOUNTER — Encounter: Payer: Self-pay | Admitting: Physical Therapy

## 2019-03-14 ENCOUNTER — Ambulatory Visit (INDEPENDENT_AMBULATORY_CARE_PROVIDER_SITE_OTHER): Payer: Self-pay | Admitting: Physical Therapy

## 2019-03-14 ENCOUNTER — Encounter: Payer: Self-pay | Admitting: Physical Therapy

## 2019-03-14 ENCOUNTER — Other Ambulatory Visit: Payer: Self-pay

## 2019-03-14 DIAGNOSIS — M25562 Pain in left knee: Secondary | ICD-10-CM

## 2019-03-14 DIAGNOSIS — M6281 Muscle weakness (generalized): Secondary | ICD-10-CM

## 2019-03-14 DIAGNOSIS — M25552 Pain in left hip: Secondary | ICD-10-CM

## 2019-03-14 DIAGNOSIS — R2689 Other abnormalities of gait and mobility: Secondary | ICD-10-CM
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
## 2019-03-16 ENCOUNTER — Encounter: Payer: Self-pay | Admitting: Family Medicine

## 2019-03-16 ENCOUNTER — Other Ambulatory Visit: Payer: Self-pay

## 2019-03-16 ENCOUNTER — Ambulatory Visit: Payer: Self-pay | Attending: Family Medicine | Admitting: Family Medicine

## 2019-03-16 DIAGNOSIS — S32402A Unspecified fracture of left acetabulum, initial encounter for closed fracture: Secondary | ICD-10-CM

## 2019-03-16 DIAGNOSIS — S2242XA Multiple fractures of ribs, left side, initial encounter for closed fracture: Secondary | ICD-10-CM

## 2019-03-16 NOTE — Progress Notes (Signed)
Patient has been called and DOB has been verified. Patient has been screened and transferred to PCP to start phone visit.     
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
## 2019-03-23 ENCOUNTER — Ambulatory Visit (INDEPENDENT_AMBULATORY_CARE_PROVIDER_SITE_OTHER): Payer: Self-pay | Admitting: Physical Therapy

## 2019-03-23 ENCOUNTER — Other Ambulatory Visit: Payer: Self-pay

## 2019-03-23 ENCOUNTER — Encounter: Payer: Self-pay | Admitting: Physical Therapy

## 2019-03-23 DIAGNOSIS — M25552 Pain in left hip: Secondary | ICD-10-CM

## 2019-03-23 DIAGNOSIS — M6281 Muscle weakness (generalized): Secondary | ICD-10-CM

## 2019-03-23 DIAGNOSIS — M25562 Pain in left knee: Secondary | ICD-10-CM

## 2019-03-23 DIAGNOSIS — R2689 Other abnormalities of gait and mobility: Secondary | ICD-10-CM
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
## 2019-04-01 ENCOUNTER — Ambulatory Visit (INDEPENDENT_AMBULATORY_CARE_PROVIDER_SITE_OTHER): Payer: Self-pay | Admitting: Physical Therapy

## 2019-04-01 ENCOUNTER — Other Ambulatory Visit: Payer: Self-pay

## 2019-04-01 DIAGNOSIS — M25562 Pain in left knee: Secondary | ICD-10-CM

## 2019-04-01 DIAGNOSIS — M25552 Pain in left hip: Secondary | ICD-10-CM

## 2019-04-01 DIAGNOSIS — R2689 Other abnormalities of gait and mobility: Secondary | ICD-10-CM

## 2019-04-01 DIAGNOSIS — M6281 Muscle weakness (generalized): Secondary | ICD-10-CM

## 2019-04-01 NOTE — Patient Instructions (Signed)
Access Code: [email or web address]  URL: [email or web address]  Date: 04/01/2019  Prepared by: Ivery Quale   Exercises  Supine Heel Slide with Strap - 10 reps - 1-2 sets - 5 hold - 2x daily - 6x weekly  Supine Quadriceps Stretch with Strap on Table - 3 sets - 30 hold - 2x daily - 6x weekly  Child's Pose Stretch - 3 sets - 30 hold - 2x daily - 6x weekly  Standard Lunge - 10 reps - 3 sets - 2x daily - 6x weekly  Forward Step Down - 10 reps - 2-3 sets - 2x daily - 6x weekly
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
## 2019-04-13 ENCOUNTER — Encounter: Payer: Self-pay | Admitting: Physical Therapy

## 2019-08-24 ENCOUNTER — Encounter (HOSPITAL_COMMUNITY): Payer: Self-pay | Admitting: Emergency Medicine

## 2019-08-24 ENCOUNTER — Other Ambulatory Visit: Payer: Self-pay

## 2019-08-24 ENCOUNTER — Emergency Department (HOSPITAL_COMMUNITY)
Admission: EM | Admit: 2019-08-24 | Discharge: 2019-08-24 | Disposition: A | Discharge status: Home / Self Care | Payer: Self-pay | Attending: Emergency Medicine | Admitting: Emergency Medicine

## 2019-08-24 DIAGNOSIS — Z79899 Other long term (current) drug therapy: Secondary | ICD-10-CM | POA: Insufficient documentation

## 2019-08-24 DIAGNOSIS — Z711 Person with feared health complaint in whom no diagnosis is made: Secondary | ICD-10-CM | POA: Insufficient documentation

## 2019-08-24 DIAGNOSIS — Z3202 Encounter for pregnancy test, result negative: Secondary | ICD-10-CM | POA: Insufficient documentation

## 2019-08-24 LAB — COMPREHENSIVE METABOLIC PANEL
ALT: 20 U/L (ref 0–44)
AST: 22 U/L (ref 15–41)
Albumin: 3.8 g/dL (ref 3.5–5.0)
Alkaline Phosphatase: 48 U/L (ref 38–126)
Anion gap: 10 (ref 5–15)
BUN: 7 mg/dL (ref 6–20)
CO2: 21 mmol/L — ABNORMAL LOW (ref 22–32)
Calcium: 8.7 mg/dL — ABNORMAL LOW (ref 8.9–10.3)
Chloride: 106 mmol/L (ref 98–111)
Creatinine, Ser: 0.74 mg/dL (ref 0.44–1.00)
GFR calc Af Amer: >60 mL/min (ref >60)
GFR calc non Af Amer: >60 mL/min (ref >60)
Glucose, Bld: 137 mg/dL — ABNORMAL HIGH (ref 70–99)
Potassium: 3.5 mmol/L (ref 3.5–5.1)
Sodium: 137 mmol/L (ref 135–145)
Total Bilirubin: 0.6 mg/dL (ref 0.3–1.2)
Total Protein: 6.9 g/dL (ref 6.5–8.1)

## 2019-08-24 LAB — URINALYSIS, ROUTINE W REFLEX MICROSCOPIC
Bacteria, UA: NONE SEEN
Bilirubin Urine: NEGATIVE
Glucose, UA: NEGATIVE mg/dL
Hgb urine dipstick: NEGATIVE
Ketones, ur: NEGATIVE mg/dL
Nitrite: NEGATIVE
Protein, ur: NEGATIVE mg/dL
Specific Gravity, Urine: 1.028 (ref 1.005–1.030)
pH: 5 (ref 5.0–8.0)

## 2019-08-24 LAB — I-STAT BETA HCG BLOOD, ED (MC, WL, AP ONLY): I-stat hCG, quantitative: <5 m[IU]/mL (ref <5)

## 2019-08-24 LAB — WET PREP, GENITAL
Sperm: NONE SEEN
Trich, Wet Prep: NONE SEEN
Yeast Wet Prep HPF POC: NONE SEEN

## 2019-08-24 LAB — GC/CHLAMYDIA PROBE AMP (~~LOC~~) NOT AT ARMC
Chlamydia: NEGATIVE
Comment: NEGATIVE
Comment: NORMAL
Neisseria Gonorrhea: NEGATIVE

## 2019-08-24 LAB — CBC
HCT: 36.7 % (ref 36.0–46.0)
Hemoglobin: 11.2 g/dL — ABNORMAL LOW (ref 12.0–15.0)
MCH: 25.3 pg — ABNORMAL LOW (ref 26.0–34.0)
MCHC: 30.5 g/dL (ref 30.0–36.0)
MCV: 82.8 fL (ref 80.0–100.0)
Platelets: 300 10*3/uL (ref 150–400)
RBC: 4.43 MIL/uL (ref 3.87–5.11)
RDW: 14.7 % (ref 11.5–15.5)
WBC: 9.6 10*3/uL (ref 4.0–10.5)
nRBC: 0 % (ref 0.0–0.2)

## 2019-08-24 MED ORDER — DOXYCYCLINE HYCLATE 100 MG PO CAPS
100.0000 mg | ORAL_CAPSULE | Freq: Two times a day (BID) | ORAL | 0 refills | Status: DC
Start: 2019-08-24 — End: 2020-03-28

## 2019-08-24 MED ORDER — DOXYCYCLINE HYCLATE 100 MG PO TABS
100.0000 mg | ORAL_TABLET | Freq: Once | ORAL | Status: AC
  Administered 2019-08-24: 100 mg via ORAL
  Filled 2019-08-24: qty 1

## 2019-08-24 MED ORDER — CEFTRIAXONE SODIUM 500 MG IJ SOLR
500.0000 mg | Freq: Once | INTRAMUSCULAR | Status: AC
  Administered 2019-08-24: 500 mg via INTRAMUSCULAR
  Filled 2019-08-24: qty 500

## 2019-08-24 NOTE — ED Triage Notes (Addendum)
Pt reports right sided pelvic pain X2 weeks.  Pain continues to get worse.  OTC medication is not relieving the pain.  Pt requests an STD check and pregnancy test.  Pt's first last cycle May 19th.

## 2019-08-24 NOTE — Discharge Instructions (Addendum)
You were seen today with concerns for STDs.  You should abstain from sexual activity for the next 10 days.  You were tested and treated for STDs empirically.  If any of these testing comes back positive you should have partners tested and treated.
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
## 2019-08-24 NOTE — ED Notes (Signed)
Patient verbalizes understanding of discharge instructions. Opportunity for questioning and answers were provided. Armband removed by staff, pt discharged from ED. Pt. ambulatory and discharged home.  

## 2019-08-29 ENCOUNTER — Ambulatory Visit: Payer: Self-pay | Admitting: Family Medicine

## 2019-09-07 ENCOUNTER — Ambulatory Visit: Payer: Self-pay

## 2019-09-07 NOTE — Telephone Encounter (Signed)
Patient called for analysis of lab work done in ER 08/24/19. She was given antibiotic and questioned if she should take the medication. Patient was advised that if given antibiotic she should take as directed on her discharge summary. She verbalized understanding.

## 2020-02-02 ENCOUNTER — Ambulatory Visit (HOSPITAL_COMMUNITY)
Admission: EM | Admit: 2020-02-02 | Discharge: 2020-02-02 | Disposition: A | Discharge status: Home / Self Care | Payer: Self-pay | Attending: Urgent Care | Admitting: Urgent Care

## 2020-02-02 ENCOUNTER — Other Ambulatory Visit: Payer: Self-pay

## 2020-02-02 ENCOUNTER — Encounter (HOSPITAL_COMMUNITY): Payer: Self-pay | Admitting: Emergency Medicine

## 2020-02-02 DIAGNOSIS — N912 Amenorrhea, unspecified: Secondary | ICD-10-CM

## 2020-02-02 DIAGNOSIS — Z3201 Encounter for pregnancy test, result positive: Secondary | ICD-10-CM

## 2020-02-02 LAB — POC URINE PREG, ED: Preg Test, Ur: POSITIVE — AB

## 2020-02-02 NOTE — ED Provider Notes (Signed)
[facility]   MRN: [id number] DOB: 09-08-1990  Subjective:   Lauren Aguilar is a 29 y.o. female presenting for amenorrhea.  She is 2 to 3 days late.  Patient is sexually active, wants to make sure she is not pregnant.  Denies fever, chest pain, shortness of breath, belly pain, pelvic pain, vaginal bleeding, vaginal discharge.  This would be her first pregnancy.  No current facility-administered medications for this encounter.  Current Outpatient Medications:  .  acetaminophen (TYLENOL) 500 MG tablet, Take 2 tablets (1,000 mg total) by mouth every 6 (six) hours. (Patient not taking: Reported on 12/15/2018), Disp: 30 tablet, Rfl: 0 .  aspirin-acetaminophen-caffeine (EXCEDRIN MIGRAINE) 250-250-65 MG tablet, Take 2 tablets by mouth daily as needed for headache., Disp: , Rfl:  .  diclofenac (VOLTAREN) 50 MG EC tablet, Take 1 tablet (50 mg total) by mouth 2 (two) times daily. (Patient not taking: Reported on 03/16/2019), Disp: 60 tablet, Rfl: 0 .  doxycycline (VIBRAMYCIN) 100 MG capsule, Take 1 capsule (100 mg total) by mouth 2 (two) times daily., Disp: 20 capsule, Rfl: 0 .  gabapentin (NEURONTIN) 100 MG capsule, Take 2 capsules (200 mg total) by mouth 2 (two) times daily. (Patient not taking: Reported on 03/16/2019), Disp: 120 capsule, Rfl: 1 .  ibuprofen (ADVIL) 600 MG tablet, Take 1 tablet (600 mg total) by mouth every 8 (eight) hours as needed. (Patient not taking: Reported on 12/15/2018), Disp: 30 tablet, Rfl: 0 .  methocarbamol (ROBAXIN) 500 MG tablet, Take 1 tablet (500 mg total) by mouth 3 (three) times daily. (Patient not taking: Reported on 03/16/2019), Disp: 60 tablet, Rfl: 1 .  metroNIDAZOLE (FLAGYL) 500 MG tablet, Take 1 tablet (500 mg total) by mouth 2 (two) times daily. (Patient not taking: Reported on 12/15/2018), Disp: 14 tablet, Rfl: 0 .  oxyCODONE (OXY IR/ROXICODONE) 5 MG immediate release tablet, Take 1-2 tablets (5-10 mg total) by mouth every 4 (four) hours as  needed (5 mg for moderate; 10 mg for severe). (Patient not taking: Reported on 12/15/2018), Disp: 30 tablet, Rfl: 0 .  Prenatal Vit-Fe Fumarate-FA (PRENATAL VITAMIN PO), Take 2 tablets by mouth daily. , Disp: , Rfl:    Allergies  Allergen Reactions  . Amoxicillin Itching and Rash    History reviewed. No pertinent past medical history.   Past Surgical History:  Procedure Laterality Date  . WISDOM TOOTH EXTRACTION      Family History  Problem Relation Age of Onset  . Diabetes Father   . Hypertension Father   . Breast cancer Paternal Grandmother     Social History   Tobacco Use  . Smoking status: Never Smoker  . Smokeless tobacco: Never Used  Vaping Use  . Vaping Use: Never used  Substance Use Topics  . Alcohol use: Yes    Comment: RARE  . Drug use: Never    ROS   Objective:   Vitals: BP 116/73 (BP Location: Right Arm)   Pulse 91   Temp 99.5 F (37.5 C) (Oral)   Resp 17   LMP 01/01/2020   SpO2 98%   Physical Exam Constitutional:      General: She is not in acute distress.    Appearance: Normal appearance. She is well-developed. She is not ill-appearing, toxic-appearing or diaphoretic.  HENT:     Head: Normocephalic and atraumatic.     Nose: Nose normal.     Mouth/Throat:     Mouth: Mucous membranes are moist.     Pharynx: Oropharynx  is clear.  Eyes:     General: No scleral icterus.       Right eye: No discharge.        Left eye: No discharge.     Extraocular Movements: Extraocular movements intact.     Conjunctiva/sclera: Conjunctivae normal.     Pupils: Pupils are equal, round, and reactive to light.  Cardiovascular:     Rate and Rhythm: Normal rate.  Pulmonary:     Effort: Pulmonary effort is normal.  Skin:    General: Skin is warm and dry.  Neurological:     General: No focal deficit present.     Mental Status: She is alert and oriented to person, place, and time.  Psychiatric:        Mood and Affect: Mood normal.        Behavior: Behavior  normal.        Thought Content: Thought content normal.        Judgment: Judgment normal.     Results for orders placed or performed during the hospital encounter of 02/02/20 (from the past 24 hour(s))  POC urine preg, ED (not at [facility])     Status: Abnormal   Collection Time: 02/02/20 10:45 AM  Result Value Ref Range   Preg Test, Ur POSITIVE (A) NEGATIVE    Assessment and Plan :   PDMP not reviewed this encounter.  1. Positive pregnancy test   2. Amenorrhea     Confirmed pregnancy.  Recommend establishing care through the [facility].  General counseling provided including avoidance of alcohol and cigarettes, caffeine.  Counseled on medications generally safe in pregnancy.  Patient has excellent vital signs, is asymptomatic.  Counseled on signs and symptoms warranting a visit to the [facility]. Counseled patient on potential for adverse effects with medications prescribed/recommended today, ER and return-to-clinic precautions discussed, patient verbalized understanding.    Wallis Bamberg, PA-C 02/02/20 1105

## 2020-03-05 ENCOUNTER — Telehealth: Payer: Self-pay

## 2020-03-17 DIAGNOSIS — Z419 Encounter for procedure for purposes other than remedying health state, unspecified: Secondary | ICD-10-CM | POA: Diagnosis not present

## 2020-03-17 NOTE — L&D Delivery Note (Signed)
OB/GYN Faculty Practice Delivery Note  Lauran Romanski is a 30 y.o. G1P0000 s/p SVD at [redacted]w[redacted]d. She was admitted for SOL.   ROM: 6h 55m with clear fluid GBS Status: Negative  Labor Progress: Expectant management AROM  Delivery Date/Time: 10/07/20 1743  Delivery: Called to room and patient was complete and pushing. Head delivered ROA. Nuchal cord was present and reduced. A shoulder dystocia was encountered and both McRoberts maneuver and suprapubic pressure were preformed without resolution. The posterior arm was then located and the axilla was grasped and rotated anteriorly followed by delivery of the posterior arm and the shoulder dystocia was relieved. Shoulder dystocia lasted 30 seconds. Shoulders and body subsequently delivered. Infant placed on mother's abdomen, dried and stimulated followed by spontaneous cry. Cord clamped x 2 after 1-minute delay, and cut by family member under my direct supervision. Cord blood drawn. Placenta delivered spontaneously with gentle cord traction. Fundus firm with massage and Pitocin. Labia, perineum, vagina, and cervix were inspected, and no lacerations were noted.   Placenta: 3 vessel cord; intact Complications: Shoulder dystocia relieved with McRoberts, Suprapubic pressure, Woods screw/Posterior arm delivery Lacerations: None EBL: 250 cc Analgesia: Epidural  Infant: Female  APGARs 7/9  3909 g  Evalina Field, MD Obstetrics Fellow

## 2020-03-20 ENCOUNTER — Encounter: Payer: Self-pay | Admitting: Advanced Practice Midwife

## 2020-03-28 ENCOUNTER — Other Ambulatory Visit: Payer: Self-pay

## 2020-03-28 ENCOUNTER — Ambulatory Visit (INDEPENDENT_AMBULATORY_CARE_PROVIDER_SITE_OTHER): Payer: Medicaid Other | Admitting: Obstetrics and Gynecology

## 2020-03-28 ENCOUNTER — Other Ambulatory Visit (HOSPITAL_COMMUNITY)
Admission: RE | Admit: 2020-03-28 | Discharge: 2020-03-28 | Disposition: A | Discharge status: Home / Self Care | Payer: Medicaid Other | Source: Ambulatory Visit | Attending: Obstetrics and Gynecology | Admitting: Obstetrics and Gynecology

## 2020-03-28 ENCOUNTER — Encounter: Payer: Self-pay | Admitting: Obstetrics and Gynecology

## 2020-03-28 VITALS — BP 126/72 | HR 76 | Wt 189.5 lb

## 2020-03-28 DIAGNOSIS — R87810 Cervical high risk human papillomavirus (HPV) DNA test positive: Secondary | ICD-10-CM

## 2020-03-28 DIAGNOSIS — Z3401 Encounter for supervision of normal first pregnancy, first trimester: Secondary | ICD-10-CM

## 2020-03-28 DIAGNOSIS — Z3A12 12 weeks gestation of pregnancy: Secondary | ICD-10-CM

## 2020-03-28 DIAGNOSIS — O99210 Obesity complicating pregnancy, unspecified trimester: Secondary | ICD-10-CM | POA: Insufficient documentation

## 2020-03-28 DIAGNOSIS — R87610 Atypical squamous cells of undetermined significance on cytologic smear of cervix (ASC-US): Secondary | ICD-10-CM

## 2020-03-28 DIAGNOSIS — Z6833 Body mass index (BMI) 33.0-33.9, adult: Secondary | ICD-10-CM | POA: Insufficient documentation

## 2020-03-28 DIAGNOSIS — Z3400 Encounter for supervision of normal first pregnancy, unspecified trimester: Secondary | ICD-10-CM | POA: Insufficient documentation

## 2020-03-28 MED ORDER — BLOOD PRESSURE KIT: 1.0000 | PACK | Freq: Once | 0 refills | Status: AC

## 2020-03-28 NOTE — Progress Notes (Signed)
New OB Note  03/28/2020   Clinic: [facility]  Chief Complaint: NOB  Transfer of Care Patient: no  History of Present Illness: Ms. Lauren Aguilar is a 30 y.o. G1P0000 @ 12/3 weeks [facility] 7/24 [tentative], based on Patient's last menstrual period was 01/01/2020.).  Preg complicated by has ASCUS with positive high risk HPV cervical; Supervision of low-risk first pregnancy; Obesity in pregnancy; and BMI 33.0-33.9,adult on their problem list.   Any events prior to today's visit: no Her periods were: regular She has Negative signs or symptoms of nausea/vomiting of pregnancy. She has Negative signs or symptoms of miscarriage or preterm labor  ROS: A 12-point review of systems was performed and negative, except as stated in the above HPI.  OBGYN History: As per HPI. OB History  Gravida Para Term Preterm AB Living  1 0 0 0 0 0  SAB IAB Ectopic Multiple Live Births  0 0 0 0 0    # Outcome Date GA Lbr Len/2nd Weight Sex Delivery Anes PTL Lv  1 Current              Past Medical History: History reviewed. No pertinent past medical history.  Past Surgical History: Past Surgical History:  Procedure Laterality Date  . WISDOM TOOTH EXTRACTION      Family History:  Family History  Problem Relation Age of Onset  . Diabetes Father   . Hypertension Father   . Breast cancer Paternal Grandmother   . Intellectual disability Paternal Grandmother   . Diabetes Paternal Grandmother   . Diabetes Mother   . Hypertension Mother   . Colon cancer Paternal Grandfather     Social History:  Social History   Socioeconomic History  . Marital status: Single    Spouse name: Not on file  . Number of children: Not on file  . Years of education: Not on file  . Highest education level: Not on file  Occupational History  . Not on file  Tobacco Use  . Smoking status: Never Smoker  . Smokeless tobacco: Never Used  Vaping Use  . Vaping Use: Never used  Substance and Sexual Activity  .  Alcohol use: Not Currently    Comment: RARE once a week on fridays.  . Drug use: Never  . Sexual activity: Not Currently    Birth control/protection: Pill, Injection  Other Topics Concern  . Not on file  Social History Narrative   ** Merged History Encounter **       Social Determinants of Health   Financial Resource Strain: Not on file  Food Insecurity: Not on file  Transportation Needs: Not on file  Physical Activity: Not on file  Stress: Not on file  Social Connections: Not on file  Intimate Partner Violence: Not on file    Allergy: Allergies  Allergen Reactions  . Amoxicillin Itching and Rash    Current Outpatient Medications: PN vitamin  Physical Exam:   BP 126/72   Pulse 76   Wt 189 lb 8 oz (86 kg)   LMP 01/01/2020   BMI 32.53 kg/m  Body mass index is 32.53 kg/m. Contractions: Not present Vag. Bleeding: None. FHTs: 160s  General appearance: Well nourished, well developed female in no acute distress.  Neck:  Supple, normal appearance, and no thyromegaly  Cardiovascular: S1, S2 normal, no murmur, rub or gallop, regular rate and rhythm Respiratory:  Clear to auscultation bilateral. Normal respiratory effort Abdomen: positive bowel sounds and no masses, hernias; diffusely non tender to palpation,  non distended Breasts: pt denies any breast s/s. Neuro/Psych:  Normal mood and affect.  Skin:  Warm and dry.  Lymphatic:  No inguinal lymphadenopathy.   Pelvic exam: is not limited by body habitus EGBUS: within normal limits, Vagina: within normal limits and with no blood in the vault, Cervix: normal appearing cervix without discharge or lesions, closed/long/high, Uterus:  12-14wk sized, nttp, and Adnexa:  normal adnexa and no mass, fullness, tenderness  Laboratory:none  Imaging: none  Assessment: pt doing well  Plan: 1. Encounter for supervision of low-risk first pregnancy in first trimester Routine care. Offer afp nv. Information on covid vaccination  given.  - [facility] AMB BABYSCRIPTS SCHEDULE OPTIMIZATION - Culture, OB Urine - Genetic Screening - CBC/D/Plt+RPR+Rh+ABO+Rub Ab... - Hemoglobin A1c - [place] MFM OB DETAIL +14 WK; Future - Comprehensive metabolic panel - Protein / creatinine ratio, urine - TSH - Cytology - PAP( [place])  2. ASCUS with positive high risk HPV cervical Rpt today  3. BMI 33.0-33.9,adult  4. Obesity in pregnancy  Problem list reviewed and updated.  Follow up in 3 weeks.  The nature of [place] [facility] with multiple MDs and other Advanced Practice Providers was explained to patient; also emphasized that residents, students are part of our team.  >50% of 30 min visit spent on counseling and coordination of care.     Cornelia Copa MD Attending Center for [facility])

## 2020-03-28 NOTE — Patient Instructions (Signed)
The [employer] recommends that all pregnant women be vaccinated against COVID-19 and that it is safe for the vaccine in all stages of pregnancy.

## 2020-03-28 NOTE — Addendum Note (Signed)
Addended by: Isabell Jarvis on: 03/28/2020 04:35 PM   Modules accepted: Orders

## 2020-03-29 LAB — COMPREHENSIVE METABOLIC PANEL
ALT: 11 IU/L (ref 0–32)
AST: 14 IU/L (ref 0–40)
Albumin/Globulin Ratio: 1.6 (ref 1.2–2.2)
Albumin: 4.3 g/dL (ref 3.9–5.0)
Alkaline Phosphatase: 65 IU/L (ref 44–121)
BUN/Creatinine Ratio: 7 — ABNORMAL LOW (ref 9–23)
BUN: 4 mg/dL — ABNORMAL LOW (ref 6–20)
Bilirubin Total: <0.2 mg/dL (ref 0.0–1.2)
CO2: 21 mmol/L (ref 20–29)
Calcium: 9.8 mg/dL (ref 8.7–10.2)
Chloride: 100 mmol/L (ref 96–106)
Creatinine, Ser: 0.6 mg/dL (ref 0.57–1.00)
GFR calc Af Amer: 142 mL/min/{1.73_m2} (ref >59)
GFR calc non Af Amer: 124 mL/min/{1.73_m2} (ref >59)
Globulin, Total: 2.7 g/dL (ref 1.5–4.5)
Glucose: 87 mg/dL (ref 65–99)
Potassium: 3.9 mmol/L (ref 3.5–5.2)
Sodium: 134 mmol/L (ref 134–144)
Total Protein: 7 g/dL (ref 6.0–8.5)

## 2020-03-29 LAB — CBC/D/PLT+RPR+RH+ABO+RUB AB...
Antibody Screen: NEGATIVE
Basophils Absolute: 0.1 10*3/uL (ref 0.0–0.2)
Basos: 0 %
EOS (ABSOLUTE): 0.2 10*3/uL (ref 0.0–0.4)
Eos: 2 %
HCV Ab: <0.1 s/co ratio (ref 0.0–0.9)
HIV Screen 4th Generation wRfx: NONREACTIVE
Hematocrit: 36.2 % (ref 34.0–46.6)
Hemoglobin: 11.3 g/dL (ref 11.1–15.9)
Hepatitis B Surface Ag: NEGATIVE
Immature Grans (Abs): 0.1 10*3/uL (ref 0.0–0.1)
Immature Granulocytes: 1 %
Lymphocytes Absolute: 3.1 10*3/uL (ref 0.7–3.1)
Lymphs: 25 %
MCH: 24.8 pg — ABNORMAL LOW (ref 26.6–33.0)
MCHC: 31.2 g/dL — ABNORMAL LOW (ref 31.5–35.7)
MCV: 79 fL (ref 79–97)
Monocytes Absolute: 1 10*3/uL — ABNORMAL HIGH (ref 0.1–0.9)
Monocytes: 8 %
Neutrophils Absolute: 8.1 10*3/uL — ABNORMAL HIGH (ref 1.4–7.0)
Neutrophils: 64 %
Platelets: 318 10*3/uL (ref 150–450)
RBC: 4.56 x10E6/uL (ref 3.77–5.28)
RDW: 14.7 % (ref 11.7–15.4)
RPR Ser Ql: NONREACTIVE
Rh Factor: POSITIVE
Rubella Antibodies, IGG: 6.83 index (ref >0.99)
WBC: 12.4 10*3/uL — ABNORMAL HIGH (ref 3.4–10.8)

## 2020-03-29 LAB — HEMOGLOBIN A1C
Est. average glucose Bld gHb Est-mCnc: 100 mg/dL
Hgb A1c MFr Bld: 5.1 % (ref 4.8–5.6)

## 2020-03-29 LAB — PROTEIN / CREATININE RATIO, URINE
Creatinine, Urine: 26.9 mg/dL
Protein, Ur: <4 mg/dL

## 2020-03-29 LAB — HCV INTERPRETATION

## 2020-03-29 LAB — TSH: TSH: 0.882 u[IU]/mL (ref 0.450–4.500)

## 2020-03-30 LAB — URINE CULTURE, OB REFLEX

## 2020-03-30 LAB — CULTURE, OB URINE

## 2020-04-02 DIAGNOSIS — Z3490 Encounter for supervision of normal pregnancy, unspecified, unspecified trimester: Secondary | ICD-10-CM | POA: Diagnosis not present

## 2020-04-02 LAB — CYTOLOGY - PAP
Chlamydia: NEGATIVE
Comment: NEGATIVE
Comment: NEGATIVE
Comment: NEGATIVE
Comment: NORMAL
Diagnosis: UNDETERMINED — AB
High risk HPV: POSITIVE — AB
Neisseria Gonorrhea: NEGATIVE
Trichomonas: NEGATIVE

## 2020-04-04 ENCOUNTER — Encounter: Payer: Self-pay | Admitting: *Deleted

## 2020-04-12 ENCOUNTER — Encounter: Payer: Self-pay | Admitting: Obstetrics and Gynecology

## 2020-04-17 DIAGNOSIS — Z419 Encounter for procedure for purposes other than remedying health state, unspecified: Secondary | ICD-10-CM | POA: Diagnosis not present

## 2020-04-26 ENCOUNTER — Other Ambulatory Visit: Payer: Self-pay

## 2020-04-26 ENCOUNTER — Ambulatory Visit (INDEPENDENT_AMBULATORY_CARE_PROVIDER_SITE_OTHER): Payer: Medicaid Other | Admitting: Obstetrics and Gynecology

## 2020-04-26 ENCOUNTER — Encounter: Payer: Self-pay | Admitting: Obstetrics and Gynecology

## 2020-04-26 VITALS — BP 121/81 | HR 96 | Wt 183.8 lb

## 2020-04-26 DIAGNOSIS — Z3402 Encounter for supervision of normal first pregnancy, second trimester: Secondary | ICD-10-CM

## 2020-04-26 DIAGNOSIS — N879 Dysplasia of cervix uteri, unspecified: Secondary | ICD-10-CM

## 2020-04-26 DIAGNOSIS — D563 Thalassemia minor: Secondary | ICD-10-CM | POA: Insufficient documentation

## 2020-04-26 MED ORDER — PRENATAL PLUS 27-1 MG PO TABS: 1.0000 | ORAL_TABLET | Freq: Every day | ORAL | 11 refills | Status: DC

## 2020-04-26 NOTE — Patient Instructions (Addendum)
Second Trimester of Pregnancy  The second trimester of pregnancy is from week 13 through week 27. This is months 4 through 6 of pregnancy. The second trimester is often a time when you feel your best. Your body has adjusted to being pregnant, and you begin to feel better physically. During the second trimester:  Morning sickness has lessened or stopped completely.  You may have more energy.  You may have an increase in appetite. The second trimester is also a time when the unborn baby (fetus) is growing rapidly. At the end of the sixth month, the fetus may be up to 12 inches long and weigh about 1 pounds. You will likely begin to feel the baby move (quickening) between 16 and 20 weeks of pregnancy. Body changes during your second trimester Your body continues to go through many changes during your second trimester. The changes vary and generally return to normal after the baby is born. Physical changes  Your weight will continue to increase. You will notice your lower abdomen bulging out.  You may begin to get stretch marks on your hips, abdomen, and breasts.  Your breasts will continue to grow and to become tender.  Dark spots or blotches (chloasma or mask of pregnancy) may develop on your face.  A dark line from your belly button to the pubic area (linea nigra) may appear.  You may have changes in your hair. These can include thickening of your hair, rapid growth, and changes in texture. Some people also have hair loss during or after pregnancy, or hair that feels dry or thin. Health changes  You may develop headaches.  You may have heartburn.  You may develop constipation.  You may develop hemorrhoids or swollen, bulging veins (varicose veins).  Your gums may bleed and may be sensitive to brushing and flossing.  You may urinate more often because the fetus is pressing on your bladder.  You may have back pain. This is caused by: ? Weight gain. ? Pregnancy hormones that  are relaxing the joints in your pelvis. ? A shift in weight and the muscles that support your balance. Follow these instructions at home: Medicines  Follow your health care provider's instructions regarding medicine use. Specific medicines may be either safe or unsafe to take during pregnancy. Do not take any medicines unless approved by your health care provider.  Take a prenatal vitamin that contains at least 600 micrograms (mcg) of folic acid. Eating and drinking  Eat a healthy diet that includes fresh fruits and vegetables, whole grains, good sources of protein such as meat, eggs, or tofu, and low-fat dairy products.  Avoid raw meat and unpasteurized juice, milk, and cheese. These carry germs that can harm you and your baby.  You may need to take these actions to prevent or treat constipation: ? Drink enough fluid to keep your urine pale yellow. ? Eat foods that are high in fiber, such as beans, whole grains, and fresh fruits and vegetables. ? Limit foods that are high in fat and processed sugars, such as fried or sweet foods. Activity  Exercise only as directed by your health care provider. Most people can continue their usual exercise routine during pregnancy. Try to exercise for 30 minutes at least 5 days a week. Stop exercising if you develop contractions in your uterus.  Stop exercising if you develop pain or cramping in the lower abdomen or lower back.  Avoid exercising if it is very hot or humid or if you are at   a high altitude.  Avoid heavy lifting.  If you choose to, you may have sex unless your health care provider tells you not to. Relieving pain and discomfort  Wear a supportive bra to prevent discomfort from breast tenderness.  Take warm sitz baths to soothe any pain or discomfort caused by hemorrhoids. Use hemorrhoid cream if your health care provider approves.  Rest with your legs raised (elevated) if you have leg cramps or low back pain.  If you develop  varicose veins: ? Wear support hose as told by your health care provider. ? Elevate your feet for 15 minutes, 3-4 times a day. ? Limit salt in your diet. Safety  Wear your seat belt at all times when driving or riding in a car.  Talk with your health care provider if someone is verbally or physically abusive to you. Lifestyle  Do not use hot tubs, steam rooms, or saunas.  Do not douche. Do not use tampons or scented sanitary pads.  Avoid cat litter boxes and soil used by cats. These carry germs that can cause birth defects in the baby and possibly loss of the fetus by miscarriage or stillbirth.  Do not use herbal remedies, alcohol, illegal drugs, or medicines that are not approved by your health care provider. Chemicals in these products can harm your baby.  Do not use any products that contain nicotine or tobacco, such as cigarettes, e-cigarettes, and chewing tobacco. If you need help quitting, ask your health care provider. General instructions  During a routine prenatal visit, your health care provider will do a physical exam and other tests. He or she will also discuss your overall health. Keep all follow-up visits. This is important.  Ask your health care provider for a referral to a local prenatal education class.  Ask for help if you have counseling or nutritional needs during pregnancy. Your health care provider can offer advice or refer you to specialists for help with various needs. Where to find more information  American Pregnancy Association: americanpregnancy.org  American College of Obstetricians and Gynecologists: acog.org/en/Womens%20Health/Pregnancy  Office on Women's Health: womenshealth.gov/pregnancy Contact a health care provider if you have:  A headache that does not go away when you take medicine.  Vision changes or you see spots in front of your eyes.  Mild pelvic cramps, pelvic pressure, or nagging pain in the abdominal area.  Persistent nausea,  vomiting, or diarrhea.  A bad-smelling vaginal discharge or foul-smelling urine.  Pain when you urinate.  Sudden or extreme swelling of your face, hands, ankles, feet, or legs.  A fever. Get help right away if you:  Have fluid leaking from your vagina.  Have spotting or bleeding from your vagina.  Have severe abdominal cramping or pain.  Have difficulty breathing.  Have chest pain.  Have fainting spells.  Have not felt your baby move for the time period told by your health care provider.  Have new or increased pain, swelling, or redness in an arm or leg. Summary  The second trimester of pregnancy is from week 13 through week 27 (months 4 through 6).  Do not use herbal remedies, alcohol, illegal drugs, or medicines that are not approved by your health care provider. Chemicals in these products can harm your baby.  Exercise only as directed by your health care provider. Most people can continue their usual exercise routine during pregnancy.  Keep all follow-up visits. This is important. This information is not intended to replace advice given to you by your   health care provider. Make sure you discuss any questions you have with your health care provider. Document Revised: 08/10/2019 Document Reviewed: 06/16/2019 Elsevier Patient Education  2021 Elsevier Inc.  Colposcopy, Care After This sheet gives you information about how to care for yourself after your procedure. Your doctor may also give you more specific instructions. If you have problems or questions, contact your doctor. What can I expect after the procedure? If you did not have a sample of your tissue taken out (did not have a biopsy), you may only have some spotting of blood for a few days. You can go back to your normal activities. If you had a sample of your tissue taken out, it is common to have:  Soreness and mild pain. These may last for a few days.  A light-headed feeling.  Mild bleeding or fluid  (discharge) coming from your vagina. The fluid will look dark and grainy. You may have this for a few days. The fluid may be caused by a liquid that was used during your procedure. You may need to wear a sanitary pad.  Spotting of blood for at least 48 hours after the procedure. Follow these instructions at home: Medicines  Take over-the-counter and prescription medicines only as told by your doctor.  Ask your doctor what medicines you can start taking again. This is very important if you take blood thinners. Activity  Limit your activity for the first day after your procedure as told by your doctor.  For at least 3 days, or for as long as told by your doctor, avoid: ? Douching. ? Using tampons. ? Having sex.  Return to your normal activities as told by your doctor. Ask your doctor what activities are safe for you. General instructions  Drink enough fluid to keep your pee (urine) pale yellow.  Ask your doctor if you may take baths, swim, or use a hot tub. You may take showers.  If you use birth control (contraception), keep using it.  Keep all follow-up visits as told by your doctor. This is important.   Contact a doctor if:  You get a skin rash. Get help right away if:  You bleed a lot from your vagina. A lot of bleeding means you use more than one pad an hour for 2 hours in a row.  You have clumps of blood (blood clots) coming from your vagina.  You have a fever or chills.  You have signs of infection. This may be fluid coming from your vagina that is: ? Different than normal. ? Yellow. ? Bad-smelling.  You have very bad pain or cramps in your lower belly that do not get better with medicine.  You faint. Summary  If you did not have a sample of your tissue taken out, you may only have some spotting of blood for a few days. You can go back to your normal activities.  If you had a sample of your tissue taken out, it is common to have mild pain for a few days and  spotting for 48 hours.  Avoid douching, using tampons, and having sex for at least 3 days after the procedure or for as long as told.  Get help right away if you have a lot of bleeding, very bad pain, or signs of infection. This information is not intended to replace advice given to you by your health care provider. Make sure you discuss any questions you have with your health care provider. Document Revised: 01/03/2020 Document Reviewed:  03/02/2019 Elsevier Patient Education  2021 Elsevier Inc.  E[employer].nl.[email or web address]=z">  Colposcopy  Colposcopy is a procedure to examine the lowest part of the uterus (cervix), the vagina, and the area around the vaginal opening (vulva) for abnormalities or signs of disease. This procedure is done using an instrument that makes objects appear larger and provides light. (colposcope). During the procedure, the health care provider may remove a tissue sample to be looked at later under a microscope (biopsy). A biopsy may be done if any unusual cells are found during the colposcopy. You may have a colposcopy if you have:  An abnormal Pap smear, also called a Pap test. This screening test is used to check for signs of cancer or infection of the vagina, cervix, and uterus.  An HPV (human papillomavirus) test and get a positive result for a type of HPV that puts you at high risk of cancer.  Certain conditions or symptoms, such as: ? A sore, or lesion, on your cervix. ? Genital warts on your vulva, vagina, or cervix. ? Pain during sex. ? Vaginal bleeding, especially after sex.  A growth on your cervix (cervical polyp) that needs to be removed. Let your health care provider know about:  Any allergies you have, including allergies to medicines, latex, or iodine.  All medicines you are taking, including vitamins, herbs, eye drops, creams, and over-the-counter medicines.  Any problems you or  family members have had with anesthetic medicines.  Any blood disorders you have.  Any surgeries you have had.  Any medical conditions you have, such as pelvic inflammatory disease (PID) or endometrial disorder.  The pattern of your menstrual cycles and the form of birth control (contraception) you use, if any.  Your medical history, including any history of fainting often or of cervical treatment.  Whether you are pregnant or may be pregnant. What are the risks? Generally, this is a safe procedure. However, problems may occur, including:  Infection. Symptoms of infection may include fever, bad-smelling vaginal discharge, or pelvic pain.  Allergic reactions to medicines.  Damage to nearby structures or organs.  Fainting. This is rare. What happens before the procedure? Eating and drinking restrictions  Follow instructions from your health care provider about eating or drinking restrictions.  You will likely need to eat a regular diet the day of the procedure and not skip any meals. Tests  You may have an exam or testing. A pregnancy test will be done the day of the procedure.  You may have a blood or urine sample taken. General instructions  Ask your health care provider about: ? Changing or stopping your regular medicines. This is especially important if you are taking diabetes medicines or blood thinners. ? Taking medicines such as aspirin and ibuprofen. These medicines can thin your blood. Do not take these medicines unless your health care provider tells you to take them. Your health care provider will likely tell you to avoid taking aspirin, or medicine that contains aspirin, for 7 days before the procedure. ? Taking over-the-counter medicines, vitamins, herbs, and supplements.  Tell your health care provider if you have your menstrual period now or will have it at the time of your procedure. A colposcopy is not normally done during your menstrual period.  If you use  contraception, continue to use it before your procedure.  For 24 hours before the procedure: ? Do not use douche products or tampons. ? Do not use medicines, creams, or suppositories in the vagina. ? Do not have sex.  Ask your health care provider what steps will be taken to prevent infection. What happens during the procedure?  You will lie down on your back, with your feet in foot rests (stirrups).  A tool called a speculum will be warmed and will have oil or gel put on it (will be lubricated). The speculum will then be inserted into your vagina. This will be used to hold apart the walls of your vagina so your health care provider can see your cervix and the inside of your vagina.  A cotton swab will be used to place a small amount of a liquid (solution) on the areas to be examined. This solution makes it easier to see abnormal cells. You may feel a slight burning during this part.  The colposcope will be used to scan the cervix with a bright white light. The colposcope will be held near your vulva and will make your vulva, vagina, and cervix look bigger so they can be seen better.  If a biopsy is needed: ? You may be given a medicine to numb the area (local anesthetic). ? Surgical tools will be used to remove mucus and cells through your vagina. ? You may feel mild pain while the tissue sample is removed. ? Bleeding may occur. A solution may be used to stop the bleeding. ? If a biopsy is needed from the inside of the cervix, a different procedure called endocervical curettage (ECC) may be done. During this procedure, a curved tool called a curette will be used to scrape cells from your cervix or the top of your cervix (endocervix).  Any abnormalities that are found will be recorded. The procedure may vary among health care providers and hospitals. What happens after the procedure?  You will lie down and rest for a few minutes. You may be offered juice or cookies.  Your blood  pressure, heart rate, breathing rate, and blood oxygen level will be monitored until you leave the hospital or clinic.  You may have some cramping in your abdomen. This should go away after a few minutes.  It is up to you to get the results of your procedure. Ask your health care provider, or the department that is doing the procedure, when your results will be ready. Summary  Colposcopy is a procedure to examine the lowest part of the uterus (cervix), the vagina, and the area around the vaginal opening (vulva) for abnormalities or signs of disease.  A biopsy may be done as part of the procedure.  After the procedure, you will remain lying down and will rest for a few minutes.  You may have some cramping in your abdomen. This should go away after a few minutes. This information is not intended to replace advice given to you by your health care provider. Make sure you discuss any questions you have with your health care provider. Document Revised: 03/02/2019 Document Reviewed: 03/02/2019 Elsevier Patient Education  2021 [employer].

## 2020-04-26 NOTE — Progress Notes (Signed)
Subjective:  Lauren Aguilar is a 30 y.o. G1P0000 at [redacted]w[redacted]d being seen today for ongoing prenatal care.  She is currently monitored for the following issues for this low-risk pregnancy and has Cervical dysplasia; Supervision of low-risk first pregnancy; Obesity in pregnancy; BMI 33.0-33.9,adult; and Alpha thalassemia silent carrier on their problem list.  Patient reports no complaints.  Contractions: Not present. Vag. Bleeding: None.  Movement: Absent. Denies leaking of fluid.   The following portions of the patient's history were reviewed and updated as appropriate: allergies, current medications, past family history, past medical history, past social history, past surgical history and problem list. Problem list updated.  Objective:   Vitals:   04/26/20 1349  BP: 121/81  Pulse: 96  Weight: 183 lb 12.8 oz (83.4 kg)    Fetal Status: Fetal Heart Rate (bpm): 151   Movement: Absent     General:  Alert, oriented and cooperative. Patient is in no acute distress.  Skin: Skin is warm and dry. No rash noted.   Cardiovascular: Normal heart rate noted  Respiratory: Normal respiratory effort, no problems with respiration noted  Abdomen: Soft, gravid, appropriate for gestational age. Pain/Pressure: Absent     Pelvic:  Cervical exam deferred        Extremities: Normal range of motion.  Edema: None  Mental Status: Normal mood and affect. Normal behavior. Normal judgment and thought content.   Urinalysis:      Assessment and Plan:  Pregnancy: G1P0000 at [redacted]w[redacted]d  1. Encounter for supervision of low-risk first pregnancy in second trimester Stable Anatomy scan scheduled - prenatal vitamin w/FE, FA (PRENATAL 1 + 1) 27-1 MG TABS tablet; Take 1 tablet by mouth daily at 12 noon.  Dispense: 30 tablet; Refill: 11  2. Cervical dysplasia Reviewed with pt Pt desires to have colpo at next OB visit  3. Alpha thalassemia silent carrier Advised to have FOB tested  Preterm labor symptoms and general obstetric  precautions including but not limited to vaginal bleeding, contractions, leaking of fluid and fetal movement were reviewed in detail with the patient. Please refer to After Visit Summary for other counseling recommendations.  Return in about 4 weeks (around 05/24/2020) for OB visit, face to face, MD only, needs colpo.   Hermina Staggers, MD

## 2020-05-01 NOTE — BH Specialist Note (Deleted)
Integrated Behavioral Health via Telemedicine Visit  05/01/2020 Lauren Aguilar [id number]  Number of Integrated Behavioral Health visits: 1 Session Start time: 2:15***  Session End time: 3:15*** Total time: {IBH Total Time:21014050}  Referring Provider: *** Patient/Family location: Home*** [facility] Provider location: [facility]  All persons participating in visit: Patient *** and [facility] [name] ***  Types of Service: {CHL AMB TYPE OF SERVICE:[phone]}  I connected with Deneise Lever and/or [name]'s {family members:20773} by Telephone  (Video is Surveyor, mining) and verified that I am speaking with the correct person using two identifiers.Discussed confidentiality: {YES/NO:21197}  I discussed the limitations of telemedicine and the availability of in person appointments.  Discussed there is a possibility of technology failure and discussed alternative modes of communication if that failure occurs.  I discussed that engaging in this telemedicine visit, they consent to the provision of behavioral healthcare and the services will be billed under their insurance.  Patient and/or legal guardian expressed understanding and consented to Telemedicine visit: {YES/NO:21197}  Presenting Concerns: Patient and/or family reports the following symptoms/concerns: *** Duration of problem: ***; Severity of problem: {Mild/Moderate/Severe:20260}  Patient and/or Family's Strengths/Protective Factors: {CHL AMB BH PROTECTIVE FACTORS:[phone]}  Goals Addressed: Patient will: 1.  Reduce symptoms of: {IBH Symptoms:21014056}  2.  Increase knowledge and/or ability of: {IBH Patient Tools:21014057}  3.  Demonstrate ability to: {IBH Goals:21014053}  Progress towards Goals: {CHL AMB BH PROGRESS TOWARDS [phone]}  Interventions: Interventions utilized:  {IBH Interventions:21014054} Standardized Assessments completed: {IBH  Screening Tools:21014051}  Patient and/or Family Response: ***  Assessment: Patient currently experiencing ***.   Patient may benefit from ***.  Plan: 1. Follow up with behavioral health clinician on : *** 2. Behavioral recommendations: *** 3. Referral(s): {IBH Referrals:21014055}  I discussed the assessment and treatment plan with the patient and/or parent/guardian. They were provided an opportunity to ask questions and all were answered. They agreed with the plan and demonstrated an understanding of the instructions.   They were advised to call back or seek an in-person evaluation if the symptoms worsen or if the condition fails to improve as anticipated.  Rae Lips, LCSW   Depression screen [facility] 2/9 04/27/2020 03/28/2020  Decreased Interest 0 1  Down, Depressed, Hopeless 0 1  PHQ - 2 Score 0 2  Altered sleeping 2 2  Tired, decreased energy 0 2  Change in appetite 1 1  Feeling bad or failure about yourself  0 1  Trouble concentrating 0 0  Moving slowly or fidgety/restless 0 0  Suicidal thoughts 0 0  PHQ-9 Score 3 8   GAD 7 : Generalized Anxiety Score 04/27/2020 03/28/2020  Nervous, Anxious, on Edge 0 1  Control/stop worrying 0 1  Worry too much - different things 0 1  Trouble relaxing 0 0  Restless 0 0  Easily annoyed or irritable 0 0  Afraid - awful might happen 0 0  Total GAD 7 Score 0 3

## 2020-05-14 ENCOUNTER — Ambulatory Visit: Payer: Medicaid Other | Attending: Obstetrics and Gynecology

## 2020-05-14 ENCOUNTER — Other Ambulatory Visit: Payer: Self-pay | Admitting: *Deleted

## 2020-05-14 ENCOUNTER — Encounter: Payer: Self-pay | Admitting: *Deleted

## 2020-05-14 ENCOUNTER — Ambulatory Visit: Payer: Medicaid Other | Admitting: *Deleted

## 2020-05-14 ENCOUNTER — Other Ambulatory Visit: Payer: Self-pay

## 2020-05-14 DIAGNOSIS — Z3401 Encounter for supervision of normal first pregnancy, first trimester: Secondary | ICD-10-CM | POA: Insufficient documentation

## 2020-05-14 DIAGNOSIS — O99212 Obesity complicating pregnancy, second trimester: Secondary | ICD-10-CM

## 2020-05-14 DIAGNOSIS — Z3A19 19 weeks gestation of pregnancy: Secondary | ICD-10-CM

## 2020-05-14 DIAGNOSIS — Z362 Encounter for other antenatal screening follow-up: Secondary | ICD-10-CM

## 2020-05-15 DIAGNOSIS — Z419 Encounter for procedure for purposes other than remedying health state, unspecified: Secondary | ICD-10-CM | POA: Diagnosis not present

## 2020-05-17 ENCOUNTER — Telehealth: Payer: Self-pay | Admitting: General Practice

## 2020-05-17 NOTE — Telephone Encounter (Signed)
I attempted to reach Lauren Aguilar today to get her scheduled for a phone visit with the Managed Medicaid Team. I left my contact information on her voicemail. I will reach out again in the next 7-14 days if I have not heard back from her.

## 2020-05-24 ENCOUNTER — Encounter: Payer: Medicaid Other | Admitting: Obstetrics and Gynecology

## 2020-05-24 ENCOUNTER — Encounter: Payer: Self-pay | Admitting: Obstetrics and Gynecology

## 2020-05-25 NOTE — Progress Notes (Signed)
Patient did not keep her OB and colposcopy appointment for 05/24/2020.  Cornelia Copa MD Attending Center for [employer] [facility])

## 2020-06-01 ENCOUNTER — Other Ambulatory Visit: Payer: Self-pay

## 2020-06-01 NOTE — Patient Instructions (Signed)
Visit Information  Lauren Aguilar was given information about Medicaid Managed Care team care coordination services as a part of their [facility] Medicaid benefit. Lauren Aguilar did not consent to engagement with the [facility] Managed Care team.   For questions related to your [facility] health plan, please call: [phone]  If you would like to schedule transportation through your [facility] plan, please call the following number at least 2 days in advance of your appointment: [phone]   Call the [facility] Crisis Line at [phone], at any time, 24 hours a day, 7 days a week. If you are in danger or need immediate medical attention call 911.  Lauren Aguilar - following are the goals we discussed in your visit today:  Goals Addressed   None      The patient has been provided with contact information for the Managed Medicaid care management team and has been advised to call with any health related questions or concerns.   Gus Puma, BSW, [facility]  [place]  High Risk Managed Medicaid Team    Following is a copy of your plan of care:  There are no care plans to display for this patient.

## 2020-06-01 NOTE — Patient Outreach (Signed)
  Medicaid Managed Care Social Work Note  06/01/2020 Name:  Lauren Aguilar MRN:  [id number] DOB:  [birth year]  [name] is an 30 y.o. year old female who is a primary patient of Patient, No Pcp Per.  [facility] Managed Care Coordination Aguilar was consulted for assistance with:  interpersonal saftety  Ms. Rapaport was given information about Medicaid Managed CareCoordination services today. Deneise Lever agreed to services and verbal consent obtained.  Engaged with patient  for by telephone forinitial visit in response to referral for case management and/or care coordination services.   Assessments/Interventions:  Review of past medical history, allergies, medications, health status, including review of consultants reports, laboratory and other test data, was performed as part of comprehensive evaluation and provision of chronic care management services.  SDOH: (Social Determinant of Health) assessments and interventions performed:  BSW spoke with patient about [facility] survey, patient stated she was fine and did not need any resources. BSW asked patient about getting set up with a PCP, patient stated that she was fine and had a doctor.   Advanced Directives Status:  Not addressed in this encounter.  Care Plan                 Allergies  Allergen Reactions  . Amoxicillin Itching and Rash    Medications Reviewed Today    Reviewed by Ala Bent, RN (Registered Nurse) on 05/14/20 at [phone]  Med List Status: <None>  Medication Order Taking? Sig Documenting Provider Last Dose Status Informant  prenatal vitamin w/FE, FA (PRENATAL 1 + 1) 27-1 MG TABS tablet [id number] Yes Take 1 tablet by mouth daily at 12 noon. Hermina Staggers, MD Taking Active           Patient Active Problem List   Diagnosis Date Noted  . Alpha thalassemia silent carrier 04/26/2020  . Supervision of low-risk first pregnancy 03/28/2020  . Obesity in pregnancy 03/28/2020  . BMI 33.0-33.9,adult 03/28/2020  .  Cervical dysplasia 12/17/2015    Conditions to be addressed/monitored per PCP order:  Interpersonal safety/PCP  There are no care plans that you recently modified to display for this patient.   Follow up:  Patient requests no follow-up at this time.  Plan: The patient has been provided with contact information for the Managed Medicaid care management Aguilar and has been advised to call with any health related questions or concerns.    Gus Puma, BSW, [facility]  Lauren  High Risk Managed Medicaid Aguilar

## 2020-06-11 ENCOUNTER — Encounter: Payer: Self-pay | Admitting: Obstetrics and Gynecology

## 2020-06-11 ENCOUNTER — Ambulatory Visit: Payer: Medicaid Other | Admitting: *Deleted

## 2020-06-11 ENCOUNTER — Encounter: Payer: Self-pay | Admitting: *Deleted

## 2020-06-11 ENCOUNTER — Other Ambulatory Visit: Payer: Self-pay

## 2020-06-11 ENCOUNTER — Ambulatory Visit (INDEPENDENT_AMBULATORY_CARE_PROVIDER_SITE_OTHER): Payer: Medicaid Other | Admitting: Obstetrics and Gynecology

## 2020-06-11 ENCOUNTER — Ambulatory Visit: Payer: Medicaid Other | Attending: Obstetrics and Gynecology

## 2020-06-11 VITALS — BP 115/67 | HR 79

## 2020-06-11 VITALS — BP 116/73 | HR 85 | Wt 195.2 lb

## 2020-06-11 DIAGNOSIS — Z362 Encounter for other antenatal screening follow-up: Secondary | ICD-10-CM

## 2020-06-11 DIAGNOSIS — O3442 Maternal care for other abnormalities of cervix, second trimester: Secondary | ICD-10-CM | POA: Diagnosis not present

## 2020-06-11 DIAGNOSIS — N879 Dysplasia of cervix uteri, unspecified: Secondary | ICD-10-CM

## 2020-06-11 DIAGNOSIS — N898 Other specified noninflammatory disorders of vagina: Secondary | ICD-10-CM

## 2020-06-11 DIAGNOSIS — Z3402 Encounter for supervision of normal first pregnancy, second trimester: Secondary | ICD-10-CM

## 2020-06-11 DIAGNOSIS — O26892 Other specified pregnancy related conditions, second trimester: Secondary | ICD-10-CM

## 2020-06-11 DIAGNOSIS — Z3A23 23 weeks gestation of pregnancy: Secondary | ICD-10-CM

## 2020-06-11 DIAGNOSIS — Z6833 Body mass index (BMI) 33.0-33.9, adult: Secondary | ICD-10-CM

## 2020-06-11 DIAGNOSIS — O99210 Obesity complicating pregnancy, unspecified trimester: Secondary | ICD-10-CM

## 2020-06-11 HISTORY — PX: COLPOSCOPY: PRO47

## 2020-06-11 NOTE — Progress Notes (Signed)
   PRENATAL VISIT NOTE  Subjective:  Lauren Aguilar is a 30 y.o. G1P0000 at [redacted]w[redacted]d being seen today for ongoing prenatal care.  She is currently monitored for the following issues for this low-risk pregnancy and has Cervical dysplasia; Supervision of low-risk first pregnancy; Obesity in pregnancy; BMI 33.0-33.9,adult; and Alpha thalassemia silent carrier on their problem list.  Patient reports vaginal discharge, occasional belly cramps.  Contractions: Not present. Vag. Bleeding: None.  Movement: Present. Denies leaking of fluid.   The following portions of the patient's history were reviewed and updated as appropriate: allergies, current medications, past family history, past medical history, past social history, past surgical history and problem list.   Objective:   Vitals:   06/11/20 1056  BP: 116/73  Pulse: 85  Weight: 195 lb 3.2 oz (88.5 kg)    Fetal Status: Fetal Heart Rate (bpm): 148   Movement: Present     General:  Alert, oriented and cooperative. Patient is in no acute distress.  Skin: Skin is warm and dry. No rash noted.   Cardiovascular: Normal heart rate noted  Respiratory: Normal respiratory effort, no problems with respiration noted  Abdomen: Soft, gravid, appropriate for gestational age.  Pain/Pressure: Absent     Pelvic: Cervical exam performed in the presence of a chaperone        Extremities: Normal range of motion.  Edema: None  Mental Status: Normal mood and affect. Normal behavior. Normal judgment and thought content.   Assessment and Plan:  Pregnancy: G1P0000 at [redacted]w[redacted]d 1. [redacted] weeks gestation of pregnancy  2. Encounter for supervision of low-risk first pregnancy in second trimester Routine care. Normal discharge of pregnancy. cx closed/long. Has completion anatomy u/s today. 28wk labs nv  3. Cervical dysplasia colpo today c/w cin 1 (see procedure note). Recommend PP colpo  4. Obesity in pregnancy  5. BMI 33.0-33.9,adult  6. Vaginal discharge during  pregnancy in second trimester  Preterm labor symptoms and general obstetric precautions including but not limited to vaginal bleeding, contractions, leaking of fluid and fetal movement were reviewed in detail with the patient. Please refer to After Visit Summary for other counseling recommendations.   Return in about 1 month (around 07/12/2020) for in person, 2hr GTT, md or app.  Future Appointments  Date Time Provider Department Center  06/11/2020  3:30 PM [facility] NURSE [facility]  06/11/2020  3:45 PM [facility] US5 [facility]  07/09/2020  8:20 AM [facility] LAB [facility]  07/09/2020  8:55 AM Burleson, Brand Males, NP [facility]    [place] Bing, MD

## 2020-06-11 NOTE — Procedures (Signed)
Colposcopy Procedure Note  Pre-operative Diagnosis: ASCUS/HPV positive pap. 23/1 weeks  Post-operative Diagnosis: CIN 1  Procedure Details   The risks (including infection, bleeding, pain) and benefits of the procedure were explained to the patient and written informed consent was obtained.  The patient was placed in the dorsal lithotomy position. A Pederson was speculum inserted in the vagina, and the cervix was visualized.  AA staining done Lugol's with green filter.  Biopsy not done. No bleeding after procedure  Findings: diffuse AWE changes circumferentially at the TZ  Adequate: Yes  Specimens: None  Condition: Stable  Complications: None  Plan: I discussed with her I recommend repeating colposcopy about 1-25m after delivery.    Cornelia Copa MD Attending Center for [employer] [facility])

## 2020-06-15 DIAGNOSIS — Z419 Encounter for procedure for purposes other than remedying health state, unspecified: Secondary | ICD-10-CM | POA: Diagnosis not present

## 2020-07-09 ENCOUNTER — Other Ambulatory Visit: Payer: Medicaid Other

## 2020-07-09 ENCOUNTER — Other Ambulatory Visit (HOSPITAL_COMMUNITY)
Admission: RE | Admit: 2020-07-09 | Discharge: 2020-07-09 | Disposition: A | Discharge status: Home / Self Care | Payer: Medicaid Other | Source: Ambulatory Visit | Attending: Nurse Practitioner | Admitting: Nurse Practitioner

## 2020-07-09 ENCOUNTER — Other Ambulatory Visit: Payer: Self-pay | Admitting: *Deleted

## 2020-07-09 ENCOUNTER — Ambulatory Visit (INDEPENDENT_AMBULATORY_CARE_PROVIDER_SITE_OTHER): Payer: Medicaid Other | Admitting: Advanced Practice Midwife

## 2020-07-09 VITALS — BP 115/69 | HR 84 | Wt 193.1 lb

## 2020-07-09 DIAGNOSIS — Z3402 Encounter for supervision of normal first pregnancy, second trimester: Secondary | ICD-10-CM

## 2020-07-09 DIAGNOSIS — N898 Other specified noninflammatory disorders of vagina: Secondary | ICD-10-CM

## 2020-07-09 DIAGNOSIS — R102 Pelvic and perineal pain: Secondary | ICD-10-CM

## 2020-07-09 DIAGNOSIS — Z23 Encounter for immunization: Secondary | ICD-10-CM | POA: Diagnosis not present

## 2020-07-09 DIAGNOSIS — O26892 Other specified pregnancy related conditions, second trimester: Secondary | ICD-10-CM | POA: Diagnosis not present

## 2020-07-09 DIAGNOSIS — Z3A27 27 weeks gestation of pregnancy: Secondary | ICD-10-CM

## 2020-07-09 LAB — POCT URINALYSIS DIP (DEVICE)
Bilirubin Urine: NEGATIVE
Glucose, UA: NEGATIVE mg/dL
Hgb urine dipstick: NEGATIVE
Ketones, ur: NEGATIVE mg/dL
Leukocytes,Ua: NEGATIVE
Nitrite: NEGATIVE
Protein, ur: NEGATIVE mg/dL
Specific Gravity, Urine: >=1.03 (ref 1.005–1.030)
Urobilinogen, UA: 0.2 mg/dL (ref 0.0–1.0)
pH: 7.5 (ref 5.0–8.0)

## 2020-07-09 NOTE — Progress Notes (Signed)
   PRENATAL VISIT NOTE  Subjective:  Lauren Aguilar is a 30 y.o. G1P0000 at [redacted]w[redacted]d being seen today for ongoing prenatal care.  She is currently monitored for the following issues for this low-risk pregnancy and has Cervical dysplasia; Supervision of low-risk first pregnancy; Obesity in pregnancy; BMI 33.0-33.9,adult; and Alpha thalassemia silent carrier on their problem list.  Patient reports urinary frequency and pressure and swelling in her labia. She denies dysuria, flank pain, fever.  Contractions: Irritability. Vag. Bleeding: None.  Movement: Present. Denies leaking of fluid.   The following portions of the patient's history were reviewed and updated as appropriate: allergies, current medications, past family history, past medical history, past social history, past surgical history and problem list. Problem list updated.  Objective:   Vitals:   07/09/20 0905  BP: 115/69  Pulse: 84  Weight: 193 lb 1.6 oz (87.6 kg)    Fetal Status: Fetal Heart Rate (bpm): 141 Fundal Height: 28 cm Movement: Present     General:  Alert, oriented and cooperative. Patient is in no acute distress.  Skin: Skin is warm and dry. No rash noted.   Cardiovascular: Normal heart rate noted  Respiratory: Normal respiratory effort, no problems with respiration noted  Abdomen: Soft, gravid, appropriate for gestational age.  Pain/Pressure: Present     Pelvic: Cervical exam deferred        Extremities: Normal range of motion.  Edema: None  Mental Status: Normal mood and affect. Normal behavior. Normal judgment and thought content.   Assessment and Plan:  Pregnancy: G1P0000 at [redacted]w[redacted]d  1. Encounter for supervision of low-risk first pregnancy in second trimester - Reviewed physiologic changes in third trimester - Initiate daily kick counts during baby's busy time of day, discussed interventions for low kick number, indications for evaluation in MAU  2. [redacted] weeks gestation of pregnancy   3. Vaginal discharge  during pregnancy in second trimester - Intermittent, typically visualized when voiding - Cervicovaginal ancillary only( [place])  4. Pelvic pressure in pregnancy, antepartum, second trimester - Urine dip WNL  Preterm labor symptoms and general obstetric precautions including but not limited to vaginal bleeding, contractions, leaking of fluid and fetal movement were reviewed in detail with the patient. Please refer to After Visit Summary for other counseling recommendations.  Return in about 2 weeks (around 07/23/2020).  Future Appointments  Date Time Provider Department Center  07/25/2020 10:15 AM Bernerd Limbo, CNM [facility]    Calvert Cantor, [place]

## 2020-07-09 NOTE — Progress Notes (Signed)
Sharp pain in belly and pressure when patient goes to the restroom. Patient also complains of frequent urination.

## 2020-07-09 NOTE — Patient Instructions (Addendum)
Third Trimester of Pregnancy  The third trimester of pregnancy is from week 28 through week 40. This is also called months 7 through 9. This trimester is when your unborn baby (fetus) is growing very fast. At the end of the ninth month, the unborn baby is about 20 inches long. It weighs about 6-10 pounds. Body changes during your third trimester Your body continues to go through many changes during this time. The changes vary and generally return to normal after the baby is born. Physical changes  Your weight will continue to increase. You may gain 25-35 pounds (11-16 kg) by the end of the pregnancy. If you are underweight, you may gain 28-40 lb (about 13-18 kg). If you are overweight, you may gain 15-25 lb (about 7-11 kg).  You may start to get stretch marks on your hips, belly (abdomen), and breasts.  Your breasts will continue to grow and may hurt. A yellow fluid (colostrum) may leak from your breasts. This is the first milk you are making for your baby.  You may have changes in your hair.  Your belly button may stick out.  You may have more swelling in your hands, face, or ankles. Health changes  You may have heartburn.  You may have trouble pooping (constipation).  You may get hemorrhoids. These are swollen veins in the butt that can itch or get painful.  You may have swollen veins (varicose veins) in your legs.  You may have more body aches in the pelvis, back, or thighs.  You may have more tingling or numbness in your hands, arms, and legs. The skin on your belly may also feel numb.  You may feel short of breath as your womb (uterus) gets bigger. Other changes  You may pee (urinate) more often.  You may have more problems sleeping.  You may notice the unborn baby "dropping," or moving lower in your belly.  You may have more discharge coming from your vagina.  Your joints may feel loose, and you may have pain around your pelvic bone. Follow these instructions at  home: Medicines  Take over-the-counter and prescription medicines only as told by your doctor. Some medicines are not safe during pregnancy.  Take a prenatal vitamin that contains at least 600 micrograms (mcg) of folic acid. Eating and drinking  Eat healthy meals that include: ? Fresh fruits and vegetables. ? Whole grains. ? Good sources of protein, such as meat, eggs, or tofu. ? Low-fat dairy products.  Avoid raw meat and unpasteurized juice, milk, and cheese. These carry germs that can harm you and your baby.  Eat 4 or 5 small meals rather than 3 large meals a day.  You may need to take these actions to prevent or treat trouble pooping: ? Drink enough fluids to keep your pee (urine) pale yellow. ? Eat foods that are high in fiber. These include beans, whole grains, and fresh fruits and vegetables. ? Limit foods that are high in fat and sugar. These include fried or sweet foods. Activity  Exercise only as told by your doctor. Stop exercising if you start to have cramps in your womb.  Avoid heavy lifting.  Do not exercise if it is too hot or too humid, or if you are in a place of great height (high altitude).  If you choose to, you may have sex unless your doctor tells you not to. Relieving pain and discomfort  Take breaks often, and rest with your legs raised (elevated) if you have   leg cramps or low back pain.  Take warm water baths (sitz baths) to soothe pain or discomfort caused by hemorrhoids. Use hemorrhoid cream if your doctor approves.  Wear a good support bra if your breasts are tender.  If you develop bulging, swollen veins in your legs: ? Wear support hose as told by your doctor. ? Raise your feet for 15 minutes, 3-4 times a day. ? Limit salt in your food. Safety  Talk to your doctor before traveling far distances.  Do not use hot tubs, steam rooms, or saunas.  Wear your seat belt at all times when you are in a car.  Talk with your doctor if someone is  hurting you or yelling at you a lot. Preparing for your baby's arrival To prepare for the arrival of your baby:  Take prenatal classes.  Visit the hospital and tour the maternity area.  Buy a rear-facing car seat. Learn how to install it in your car.  Prepare the baby's room. Take out all pillows and stuffed animals from the baby's crib. General instructions  Avoid cat litter boxes and soil used by cats. These carry germs that can cause harm to the baby and can cause a loss of your baby by miscarriage or stillbirth.  Do not douche or use tampons. Do not use scented sanitary pads.  Do not smoke or use any products that contain nicotine or tobacco. If you need help quitting, ask your doctor.  Do not drink alcohol.  Do not use herbal medicines, illegal drugs, or medicines that were not approved by your doctor. Chemicals in these products can affect your baby.  Keep all follow-up visits. This is important. Where to find more information  American Pregnancy Association: americanpregnancy.org  American College of Obstetricians and Gynecologists: www.acog.org  Office on Women's Health: womenshealth.gov/pregnancy Contact a doctor if:  You have a fever.  You have mild cramps or pressure in your lower belly.  You have a nagging pain in your belly area.  You vomit, or you have watery poop (diarrhea).  You have bad-smelling fluid coming from your vagina.  You have pain when you pee, or your pee smells bad.  You have a headache that does not go away when you take medicine.  You have changes in how you see, or you see spots in front of your eyes. Get help right away if:  Your water breaks.  You have regular contractions that are less than 5 minutes apart.  You are spotting or bleeding from your vagina.  You have very bad belly cramps or pain.  You have trouble breathing.  You have chest pain.  You faint.  You have not felt the baby move for the amount of time told by  your doctor.  You have new or increased pain, swelling, or redness in an arm or leg. Summary  The third trimester is from week 28 through week 40 (months 7 through 9). This is the time when your unborn baby is growing very fast.  During this time, your discomfort may increase as you gain weight and as your baby grows.  Get ready for your baby to arrive by taking prenatal classes, buying a rear-facing car seat, and preparing the baby's room.  Get help right away if you are bleeding from your vagina, you have chest pain and trouble breathing, or you have not felt the baby move for the amount of time told by your doctor. This information is not intended to replace advice given   to you by your health care provider. Make sure you discuss any questions you have with your health care provider. Document Revised: 08/10/2019 Document Reviewed: 06/16/2019 Elsevier Patient Education  2021 Elsevier Inc.   Oral Glucose Tolerance Test During Pregnancy Why am I having this test? The oral glucose tolerance test (OGTT) is done to check how your body processes blood sugar (glucose). This is one of several tests used to diagnose diabetes that develops during pregnancy (gestational diabetes mellitus). Gestational diabetes is a short-term form of diabetes that some women develop while they are pregnant. It usually occurs during the second trimester of pregnancy and goes away after delivery. Testing, or screening, for gestational diabetes usually occurs at weeks 24-28 of pregnancy. You may have the OGTT test after having a 1-hour glucose screening test if the results from that test indicate that you may have gestational diabetes. This test may also be needed if:  You have a history of gestational diabetes.  There is a history of giving birth to very large babies or of losing pregnancies (having stillbirths).  You have signs and symptoms of diabetes, such as: ? Changes in your eyesight. ? Tingling or numbness in  your hands or feet. ? Changes in hunger, thirst, and urination, and these are not explained by your pregnancy. What is being tested? This test measures the amount of glucose in your blood at different times during a period of 3 hours. This shows how well your body can process glucose. What kind of sample is taken? Blood samples are required for this test. They are usually collected by inserting a needle into a blood vessel.   How do I prepare for this test?  For 3 days before your test, eat normally. Have plenty of carbohydrate-rich foods.  Follow instructions from your health care provider about: ? Eating or drinking restrictions on the day of the test. You may be asked not to eat or drink anything other than water (to fast) starting 8-10 hours before the test. ? Changing or stopping your regular medicines. Some medicines may interfere with this test. Tell a health care provider about:  All medicines you are taking, including vitamins, herbs, eye drops, creams, and over-the-counter medicines.  Any blood disorders you have.  Any surgeries you have had.  Any medical conditions you have. What happens during the test? First, your blood glucose will be measured. This is referred to as your fasting blood glucose because you fasted before the test. Then, you will drink a glucose solution that contains a certain amount of glucose. Your blood glucose will be measured again 1, 2, and 3 hours after you drink the solution. This test takes about 3 hours to complete. You will need to stay at the testing location during this time. During the testing period:  Do not eat or drink anything other than the glucose solution.  Do not exercise.  Do not use any products that contain nicotine or tobacco, such as cigarettes, e-cigarettes, and chewing tobacco. These can affect your test results. If you need help quitting, ask your health care provider. The testing procedure may vary among health care providers  and hospitals. How are the results reported? Your results will be reported as milligrams of glucose per deciliter of blood (mg/dL) or millimoles per liter (mmol/L). There is more than one source for screening and diagnosis reference values used to diagnose gestational diabetes. Your health care provider will compare your results to normal values that were established after testing a large  group of people (reference values). Reference values may vary among labs and hospitals. For this test (Carpenter-Coustan), reference values are:  Fasting: 95 mg/dL (5.3 mmol/L).  1 hour: 180 mg/dL (81.0 mmol/L).  2 hour: 155 mg/dL (8.6 mmol/L).  3 hour: 140 mg/dL (7.8 mmol/L). What do the results mean? Results below the reference values are considered normal. If two or more of your blood glucose levels are at or above the reference values, you may be diagnosed with gestational diabetes. If only one level is high, your health care provider may suggest repeat testing or other tests to confirm a diagnosis. Talk with your health care provider about what your results mean. Questions to ask your health care provider Ask your health care provider, or the department that is doing the test:  When will my results be ready?  How will I get my results?  What are my treatment options?  What other tests do I need?  What are my next steps? Summary  The oral glucose tolerance test (OGTT) is one of several tests used to diagnose diabetes that develops during pregnancy (gestational diabetes mellitus). Gestational diabetes is a short-term form of diabetes that some women develop while they are pregnant.  You may have the OGTT test after having a 1-hour glucose screening test if the results from that test show that you may have gestational diabetes. You may also have this test if you have any symptoms or risk factors for this type of diabetes.  Talk with your health care provider about what your results mean. This  information is not intended to replace advice given to you by your health care provider. Make sure you discuss any questions you have with your health care provider. Document Revised: 08/11/2019 Document Reviewed: 08/11/2019 Elsevier Patient Education  2021 [employer].

## 2020-07-10 ENCOUNTER — Encounter: Payer: Self-pay | Admitting: Advanced Practice Midwife

## 2020-07-10 ENCOUNTER — Telehealth: Payer: Self-pay

## 2020-07-10 DIAGNOSIS — O24419 Gestational diabetes mellitus in pregnancy, unspecified control: Secondary | ICD-10-CM | POA: Insufficient documentation

## 2020-07-10 DIAGNOSIS — D509 Iron deficiency anemia, unspecified: Secondary | ICD-10-CM | POA: Insufficient documentation

## 2020-07-10 DIAGNOSIS — O99019 Anemia complicating pregnancy, unspecified trimester: Secondary | ICD-10-CM | POA: Insufficient documentation

## 2020-07-10 LAB — CBC
Hematocrit: 31.9 % — ABNORMAL LOW (ref 34.0–46.6)
Hemoglobin: 9.9 g/dL — ABNORMAL LOW (ref 11.1–15.9)
MCH: 24.9 pg — ABNORMAL LOW (ref 26.6–33.0)
MCHC: 31 g/dL — ABNORMAL LOW (ref 31.5–35.7)
MCV: 80 fL (ref 79–97)
Platelets: 273 10*3/uL (ref 150–450)
RBC: 3.97 x10E6/uL (ref 3.77–5.28)
RDW: 14.6 % (ref 11.7–15.4)
WBC: 13 10*3/uL — ABNORMAL HIGH (ref 3.4–10.8)

## 2020-07-10 LAB — CERVICOVAGINAL ANCILLARY ONLY
Bacterial Vaginitis (gardnerella): POSITIVE — AB
Candida Glabrata: NEGATIVE
Candida Vaginitis: NEGATIVE
Comment: NEGATIVE
Comment: NEGATIVE
Comment: NEGATIVE

## 2020-07-10 LAB — GLUCOSE TOLERANCE, 2 HOURS W/ 1HR
Glucose, 1 hour: 119 mg/dL (ref 65–179)
Glucose, 2 hour: 169 mg/dL — ABNORMAL HIGH (ref 65–152)
Glucose, Fasting: 91 mg/dL (ref 65–91)

## 2020-07-10 LAB — RPR: RPR Ser Ql: NONREACTIVE

## 2020-07-10 LAB — HIV ANTIBODY (ROUTINE TESTING W REFLEX): HIV Screen 4th Generation wRfx: NONREACTIVE

## 2020-07-10 MED ORDER — METRONIDAZOLE 500 MG PO TABS
500.0000 mg | ORAL_TABLET | Freq: Two times a day (BID) | ORAL | 0 refills | Status: DC
Start: 2020-07-10 — End: 2020-08-14

## 2020-07-10 NOTE — Addendum Note (Signed)
Addended by: Calvert Cantor on: 07/10/2020 02:26 PM   Modules accepted: Orders

## 2020-07-10 NOTE — Telephone Encounter (Addendum)
-----   Message from Calvert Cantor, [place] sent at 07/10/2020  7:37 AM EDT ----- Hgb 9.9. Please initiate oral iron per standard work. Thank you!  Pt also has Gestational Diabetes based on elevated result for 2 hour draw. Please notify and coordinate appointment with educator, supplies    Notified pt of results, GDM and the need for iron tablet.  Pt reports that due to her job she is not able to come to frequent appts.  I explained to the pt that it is imperative that she comes for education about her dx of GDM as if her diabetes is left uncontrolled it can be fatal to baby.  I informed pt that the education appt will teach her about using her glucose meter, checking her blood sugars, and nutrition.  Pt states that she is going to have to cancel her provider appt on 07/25/20 because she can't miss work.  I advised pt that with her new dx she may have more appts than what she was going to have because of her now being GDM and to speak with her employer.  Pt reports that she will be able to come on May 3rd @ 0915 for diabetes education.  I encouraged pt to keep her scheduled provider appt on 07/25/20 because if we cancel the appt it may be difficult for her get another appt sooner.  Pt verbalized understanding with no further questions.    Addison Naegeli, RN  07/10/20

## 2020-07-15 DIAGNOSIS — Z419 Encounter for procedure for purposes other than remedying health state, unspecified: Secondary | ICD-10-CM | POA: Diagnosis not present

## 2020-07-17 ENCOUNTER — Other Ambulatory Visit: Payer: Self-pay | Admitting: Lactation Services

## 2020-07-17 ENCOUNTER — Encounter: Payer: Medicaid Other | Attending: Advanced Practice Midwife | Admitting: Registered"

## 2020-07-17 ENCOUNTER — Other Ambulatory Visit: Payer: Self-pay

## 2020-07-17 ENCOUNTER — Ambulatory Visit: Payer: Medicaid Other | Admitting: Registered"

## 2020-07-17 DIAGNOSIS — O24419 Gestational diabetes mellitus in pregnancy, unspecified control: Secondary | ICD-10-CM | POA: Diagnosis not present

## 2020-07-17 MED ORDER — ACCU-CHEK SOFTCLIX LANCETS MISC
11 refills | Status: DC
Start: 2020-07-17 — End: 2020-10-08

## 2020-07-17 MED ORDER — ACCU-CHEK GUIDE VI STRP
ORAL_STRIP | 11 refills | Status: DC
Start: 2020-07-17 — End: 2020-10-08

## 2020-07-17 NOTE — Progress Notes (Signed)
Patient was seen on 07/17/20 for Gestational Diabetes self-management. EDD 10/07/20; Lauren Aguilar Patient states no history of GDM. Diet history obtained. Patient eats variety of all food groups. Beverages include juice, soda, kool-aid, water. Pt states she loves fruit and sweets. Pt states she enjoys a variety of vegetables.  Patient reports family history of Type 2 Diabetes A1c 5.1% lab 03/28/2020   The following learning objectives were met by the patient :   States the definition of Gestational Diabetes  States why dietary management is important in controlling blood glucose  Describes the effects of carbohydrates on blood glucose levels  Demonstrates ability to create a balanced meal plan  Demonstrates carbohydrate counting   States when to check blood glucose levels  Demonstrates proper blood glucose monitoring techniques  States the effect of stress and exercise on blood glucose levels  States the importance of limiting caffeine and abstaining from alcohol and smoking  Plan:  Aim for 3 Carbohydrate Choices per meal (45 grams) +/- 1 either way  Aim for 1-2 Carbohydrate Choices per snack Begin reading food labels for Total Carbohydrate of foods If OK with your MD, consider  increasing your activity level by walking, Arm Chair Exercises or other activity daily as tolerated Begin checking Blood Glucose before breakfast and 2 hours after first bite of breakfast, lunch and dinner as directed by MD  Bring Log Book/Sheet and meter to every medical appointment  Take medication if directed by MD  Blood glucose monitor given: Accu-chek Guide Me Lot #[id number]  Exp: 07/20/2021 CBG: 181 mg/dL (1 hr after pancakes and syrup)  Rx order placed for  Accu-chek Guide strips and Softclix lancets  Patient instructed to monitor glucose levels: FBS: 60 - 95 mg/dl 2 hour: <120 mg/dl  Patient received the following handouts:  Nutrition Diabetes and Pregnancy  Carbohydrate Counting List  Blood glucose  Log Sheet  Patient will be seen for follow-up in 4 weeks or as needed.

## 2020-07-17 NOTE — Progress Notes (Signed)
Supplies for blood sugar monitoring ordered per Heywood Bene, RD.

## 2020-07-18 ENCOUNTER — Other Ambulatory Visit: Payer: Self-pay | Admitting: *Deleted

## 2020-07-18 DIAGNOSIS — O24419 Gestational diabetes mellitus in pregnancy, unspecified control: Secondary | ICD-10-CM

## 2020-07-25 ENCOUNTER — Telehealth (INDEPENDENT_AMBULATORY_CARE_PROVIDER_SITE_OTHER): Payer: Medicaid Other | Admitting: Certified Nurse Midwife

## 2020-07-25 ENCOUNTER — Encounter: Payer: Self-pay | Admitting: Certified Nurse Midwife

## 2020-07-25 VITALS — BP 114/68 | Wt 195.0 lb

## 2020-07-25 DIAGNOSIS — Z3A29 29 weeks gestation of pregnancy: Secondary | ICD-10-CM

## 2020-07-25 DIAGNOSIS — Z3400 Encounter for supervision of normal first pregnancy, unspecified trimester: Secondary | ICD-10-CM

## 2020-07-25 DIAGNOSIS — O24410 Gestational diabetes mellitus in pregnancy, diet controlled: Secondary | ICD-10-CM

## 2020-07-25 DIAGNOSIS — Z3403 Encounter for supervision of normal first pregnancy, third trimester: Secondary | ICD-10-CM

## 2020-07-25 NOTE — Progress Notes (Signed)
   OBSTETRICS PRENATAL VIRTUAL VISIT ENCOUNTER NOTE  Provider location: [facility] for [facility]   Patient location: Home  I connected with Lauren Aguilar on 07/25/20 at 10:15 AM EDT by MyChart Video Encounter and verified that I am speaking with the correct person using two identifiers. I discussed the limitations, risks, security and privacy concerns of performing an evaluation and management service virtually and the availability of in person appointments. I also discussed with the patient that there may be a patient responsible charge related to this service. The patient expressed understanding and agreed to proceed. Subjective:  Lauren Aguilar is a 30 y.o. G1P0000 at [redacted]w[redacted]d being seen today for ongoing prenatal care.  She is currently monitored for the following issues for this high-risk pregnancy and has Cervical dysplasia; Supervision of low-risk first pregnancy; Obesity in pregnancy; BMI 33.0-33.9,adult; Alpha thalassemia silent carrier; Iron deficiency anemia of mother during pregnancy; and Gestational diabetes on their problem list.  Patient reports no complaints.  Contractions: Irritability. Vag. Bleeding: None.  Movement: Present. Denies any leaking of fluid.   The following portions of the patient's history were reviewed and updated as appropriate: allergies, current medications, past family history, past medical history, past social history, past surgical history and problem list.   Objective:   Vitals:   07/25/20 1032  BP: 114/68  Weight: 195 lb (88.5 kg)    Fetal Status:     Movement: Present     General:  Alert, oriented and cooperative. Patient is in no acute distress.  Respiratory: Normal respiratory effort, no problems with respiration noted  Mental Status: Normal mood and affect. Normal behavior. Normal judgment and thought content.  Rest of physical exam deferred due to type of encounter  Imaging: No results found.  Assessment and  Plan:  Pregnancy: G1P0000 at [redacted]w[redacted]d 1. Encounter for supervision of low-risk first pregnancy, antepartum - Doing well, feeling vigorous fetal movement  2. [redacted] weeks gestation of pregnancy - No questions, plans to do every other visit via MyChart to cut down on ride fees. Has tried our transportation services, they do not reliably show up.  3. Diet controlled gestational diabetes mellitus (GDM) in third trimester - Reviewed glucose logs, all fastings WNL. Had some elevated post-prandials up to 130.  - Reviewed pt's eating habits and brainstormed ways to make favorites more GDM diet friendly. Encouraged increase in protein at meals and discouraged skipping meals. Pt expressed understanding.  Preterm labor symptoms and general obstetric precautions including but not limited to vaginal bleeding, contractions, leaking of fluid and fetal movement were reviewed in detail with the patient. I discussed the assessment and treatment plan with the patient. The patient was provided an opportunity to ask questions and all were answered. The patient agreed with the plan and demonstrated an understanding of the instructions. The patient was advised to call back or seek an in-person office evaluation/go to [facility] at [facility] for any urgent or concerning symptoms. Please refer to After Visit Summary for other counseling recommendations.   I provided 15 minutes of face-to-face time during this encounter.  Return in about 2 weeks (around 08/08/2020) for IN-PERSON, LOB.  Future Appointments  Date Time Provider Department Center  08/14/2020  9:15 AM [facility]    Bernerd Limbo, CNM Center for [employer], [facility]

## 2020-07-25 NOTE — Progress Notes (Signed)
I connected with  Lauren Aguilar on 07/25/20 at 10:15 AM EDT by MyChart Virtual Video Visit and verified that I am speaking with the correct person using two identifiers.   I discussed the limitations, risks, security and privacy concerns of performing an evaluation and management service by telephone and the availability of in person appointments. I also discussed with the patient that there may be a patient responsible charge related to this service. The patient expressed understanding and agreed to proceed.  Guy Begin, CMA 07/25/2020  10:27 AM

## 2020-08-14 ENCOUNTER — Ambulatory Visit (INDEPENDENT_AMBULATORY_CARE_PROVIDER_SITE_OTHER): Payer: Medicaid Other | Admitting: Obstetrics and Gynecology

## 2020-08-14 ENCOUNTER — Other Ambulatory Visit: Payer: Self-pay

## 2020-08-14 ENCOUNTER — Encounter: Payer: Medicaid Other | Attending: Advanced Practice Midwife | Admitting: Registered"

## 2020-08-14 ENCOUNTER — Telehealth (INDEPENDENT_AMBULATORY_CARE_PROVIDER_SITE_OTHER): Payer: Medicaid Other | Admitting: Registered"

## 2020-08-14 VITALS — BP 108/60 | HR 76 | Wt 196.4 lb

## 2020-08-14 DIAGNOSIS — Z3A32 32 weeks gestation of pregnancy: Secondary | ICD-10-CM

## 2020-08-14 DIAGNOSIS — O24419 Gestational diabetes mellitus in pregnancy, unspecified control: Secondary | ICD-10-CM

## 2020-08-14 DIAGNOSIS — Z6833 Body mass index (BMI) 33.0-33.9, adult: Secondary | ICD-10-CM

## 2020-08-14 DIAGNOSIS — Z3A00 Weeks of gestation of pregnancy not specified: Secondary | ICD-10-CM | POA: Diagnosis not present

## 2020-08-14 DIAGNOSIS — Z3400 Encounter for supervision of normal first pregnancy, unspecified trimester: Secondary | ICD-10-CM

## 2020-08-14 DIAGNOSIS — O24410 Gestational diabetes mellitus in pregnancy, diet controlled: Secondary | ICD-10-CM

## 2020-08-14 DIAGNOSIS — Z5941 Food insecurity: Secondary | ICD-10-CM

## 2020-08-14 NOTE — Addendum Note (Signed)
Addended by: Guy Begin on: 08/14/2020 04:53 PM   Modules accepted: Orders

## 2020-08-14 NOTE — Progress Notes (Signed)
   PRENATAL VISIT NOTE  Subjective:  Lauren Aguilar is a 30 y.o. G1P0000 at [redacted]w[redacted]d being seen today for ongoing prenatal care.  She is currently monitored for the following issues for this low-risk pregnancy and has Cervical dysplasia; Supervision of low-risk first pregnancy; Obesity in pregnancy; BMI 33.0-33.9,adult; Alpha thalassemia silent carrier; Iron deficiency anemia of mother during pregnancy; and Gestational diabetes on their problem list.  Patient reports no complaints.  Contractions: Not present. Vag. Bleeding: None.  Movement: Present. Denies leaking of fluid.   CBG log reviewed and appropriate. >90% at goal.   The following portions of the patient's history were reviewed and updated as appropriate: allergies, current medications, past family history, past medical history, past social history, past surgical history and problem list.   Objective:   Vitals:   08/14/20 1601  BP: 108/60  Pulse: 76  Weight: 196 lb 6.4 oz (89.1 kg)    Fetal Status: Fetal Heart Rate (bpm): 140   Movement: Present     General:  Alert, oriented and cooperative. Patient is in no acute distress.  Skin: Skin is warm and dry. No rash noted.   Cardiovascular: Normal heart rate noted  Respiratory: Normal respiratory effort, no problems with respiration noted  Abdomen: Soft, gravid, appropriate for gestational age.  Pain/Pressure: Present     Pelvic: Cervical exam deferred        Extremities: Normal range of motion.  Edema: Trace  Mental Status: Normal mood and affect. Normal behavior. Normal judgment and thought content.   Assessment and Plan:  Pregnancy: G1P0000 at [redacted]w[redacted]d 1. Encounter for supervision of low-risk first pregnancy, antepartum    2. [redacted] weeks gestation of pregnancy    3. Diet controlled gestational diabetes mellitus (GDM) in third trimester -Doing well overall. Molinda Bailiff RD today.   4. BMI 33.0-33.9,adult    Preterm labor symptoms and general obstetric precautions including but  not limited to vaginal bleeding, contractions, leaking of fluid and fetal movement were reviewed in detail with the patient. Please refer to After Visit Summary for other counseling recommendations.   Return in about 2 weeks (around 08/28/2020) for OB, any provider.  No future appointments.  Gita Kudo, MD

## 2020-08-14 NOTE — Patient Instructions (Signed)

## 2020-08-14 NOTE — Patient Instructions (Signed)
Things to try to help bring down your morning fasting blood sugar: Exercise in the evenings 15-20 minutes Avoid eating dinner close to bedtime High fat meals for dinner could be increasing your fasting blood sugar  When having symptoms of dizziness think back to what you ate and identify foods that have simple carbohydrates in them such as things sweetened with sugar, natural in the form of juice or added.  Continue with changes you have made that are helping you control your blood sugar with diet.  If you are not able to get more lancets at the pharmacy, you can pick up a box over the counter.

## 2020-08-14 NOTE — Progress Notes (Signed)
Virtual Visit via Video Note  I connected with Lauren Aguilar on 08/14/20 at  9:15 AM EDT by a video enabled telemedicine application and verified that I am speaking with the correct person using two identifiers.  Location: Patient: home Provider: [facility]; [facility] [employer]   I discussed the limitations of evaluation and management by telemedicine and the availability of in person appointments. The patient expressed understanding and agreed to proceed.  History of Present Illness: Gestational Diabetes. EDD 10/07/20; [redacted]w[redacted]d. Diet Controlled  Observations/Objective: Patient states changes to diet/lifestyle including purchasing fruit and will start including more in diet (strawberries, oranges, watermelon). Pt purchases sugar-free and no-sugar added products. Continues to eat a variety vegetables and drinking zero sugar beverages.  Breakfast: waffles, sugar-free syrup OR egg sandwich OR cereal with oat milk Snack: cheese Lunch: sandwich, meat, chips handful, sparkling water Snack:  Dinner:panera bread OR pizza OR corn on the cob, pork chops, cream of mushrooms OR lobster, broccoli & asparagus Bev: water, crystal light  Eats dinner around 9 pm and has a 10:30-11 pm bedtime  Patient is testing blood glucose as directed pre breakfast and 2 hours after each meal. Patient states she lost needles and has not checked blood sugar in a couple of days  Review of Blood Glucose Log: mostly WNL, a few elevated FBS  Assessment and Plan: Things to try to help bring down your morning fasting blood sugar: Exercise in the evenings 15-20 minutes Avoid eating dinner close to bedtime High fat meals for dinner could be increasing your fasting blood sugar  When having symptoms of dizziness think back to what you ate and identify foods that have simple carbohydrates in them such as things sweetened with sugar, natural in the form of juice or added.  Continue with changes you have  made that are helping you control your blood sugar with diet.  If you are not able to get more lancets at the pharmacy, you can pick up a box over the counter.  Follow Up Instructions: Return for follow-up as needed.   I discussed the assessment and treatment plan with the patient. The patient was provided an opportunity to ask questions and all were answered. The patient agreed with the plan and demonstrated an understanding of the instructions.   The patient was advised to call back or seek an in-person evaluation if the symptoms worsen or if the condition fails to improve as anticipated.  I provided 30 minutes of non-face-to-face time during this encounter.   Carolan Shiver, RD, CDCES

## 2020-08-15 DIAGNOSIS — Z419 Encounter for procedure for purposes other than remedying health state, unspecified: Secondary | ICD-10-CM | POA: Diagnosis not present

## 2020-08-21 ENCOUNTER — Telehealth: Payer: Self-pay | Admitting: Family Medicine

## 2020-08-21 NOTE — Telephone Encounter (Signed)
Patient called said she want a call back she have a few general questions

## 2020-08-21 NOTE — Telephone Encounter (Signed)
Called pt and pt informed me that she had three questions.  One, she needed a dental clearance letter.  I advised pt that she can access the dental letter in her chart as it is available to her now.  Pt then asks secondly, where will I deliver at?  I informed pt [facility], entrance C, in which we can map will be in her Mychart as well.  Pt states thirdly, if she could get a Peds List?  I informed pt that a Peds List will also be available to her in MyChart.  Pt verbalized understanding with on further questions.   Lauren Aguilar  08/21/20

## 2020-08-30 ENCOUNTER — Telehealth (INDEPENDENT_AMBULATORY_CARE_PROVIDER_SITE_OTHER): Payer: Medicaid Other | Admitting: Obstetrics and Gynecology

## 2020-08-30 VITALS — BP 93/60 | HR 87

## 2020-08-30 DIAGNOSIS — Z3400 Encounter for supervision of normal first pregnancy, unspecified trimester: Secondary | ICD-10-CM

## 2020-08-30 DIAGNOSIS — O24410 Gestational diabetes mellitus in pregnancy, diet controlled: Secondary | ICD-10-CM

## 2020-08-30 DIAGNOSIS — Z3A34 34 weeks gestation of pregnancy: Secondary | ICD-10-CM

## 2020-08-30 NOTE — Progress Notes (Signed)
Patient stated that she has been having white vaginal discharge that started around 5 days ago, denies any odor or vaginal itching. Patient stated that she is doing well.

## 2020-08-30 NOTE — Progress Notes (Signed)
      TELEHEALTH OBSTETRICS VISIT ENCOUNTER NOTE  Provider location: [facility] for [facility]   Patient location: Home  I connected with Lauren Aguilar on 08/30/20 at  9:15 AM EDT by telephone at home and verified that I am speaking with the correct person using two identifiers. Of note, unable to do video encounter due to technical difficulties.    I discussed the limitations, risks, security and privacy concerns of performing an evaluation and management service by telephone and the availability of in person appointments. I also discussed with the patient that there may be a patient responsible charge related to this service. The patient expressed understanding and agreed to proceed.   Subjective:  Lauren Aguilar is a 30 y.o. G1P0000 at [redacted]w[redacted]d being seen today for ongoing prenatal care.  She is currently monitored for the following issues for this low-risk pregnancy and has Cervical dysplasia; Supervision of low-risk first pregnancy; Obesity in pregnancy; BMI 33.0-33.9,adult; Alpha thalassemia silent carrier; Iron deficiency anemia of mother during pregnancy; and Gestational diabetes on their problem list.  Patient reports  vaginal discharge .  Contractions: Not present. Vag. Bleeding: None.  Movement: Present. Denies leaking of fluid.   Reports CBG has been appropriate at home.   White vaginal discharge for past five days. No itching or foul smell. No dysuria.   The following portions of the patient's history were reviewed and updated as appropriate: allergies, current medications, past family history, past medical history, past social history, past surgical history and problem list.   Objective:   Vitals:   08/30/20 0921  BP: 93/60  Pulse: 87    Fetal Status:     Movement: Present     General:  Alert, oriented and cooperative. Patient is in no acute distress.                        Assessment and Plan:  Pregnancy: G1P0000 at [redacted]w[redacted]d 1.  Encounter for supervision of low-risk first pregnancy, antepartum -doing well overall. Will have her come in for vaginal swabs given discharge. GBS at next visit.   2. [redacted] weeks gestation of pregnancy    3. Diet controlled gestational diabetes mellitus (GDM) in third trimester -appropriate CBG at home   Preterm labor symptoms and general obstetric precautions including but not limited to vaginal bleeding, contractions, leaking of fluid and fetal movement were reviewed in detail with the patient. Please refer to After Visit Summary for other counseling recommendations.   Return in about 2 weeks (around 09/13/2020) for OB.  No future appointments.  Gita Kudo, MD

## 2020-08-30 NOTE — Progress Notes (Signed)
I connected with  Lauren Aguilar on 08/30/20 at  9:15 AM EDT by MyChart Virtual Video Visit and verified that I am speaking with the correct person using two identifiers.   I discussed the limitations, risks, security and privacy concerns of performing an evaluation and management service by telephone and the availability of in person appointments. I also discussed with the patient that there may be a patient responsible charge related to this service. The patient expressed understanding and agreed to proceed.  Guy Begin, CMA 08/30/2020  9:18 AM

## 2020-09-10 ENCOUNTER — Encounter: Payer: Self-pay | Admitting: Family Medicine

## 2020-09-13 ENCOUNTER — Other Ambulatory Visit (HOSPITAL_COMMUNITY)
Admission: RE | Admit: 2020-09-13 | Discharge: 2020-09-13 | Disposition: A | Discharge status: Home / Self Care | Payer: Medicaid Other | Source: Ambulatory Visit | Attending: Certified Nurse Midwife | Admitting: Certified Nurse Midwife

## 2020-09-13 ENCOUNTER — Other Ambulatory Visit: Payer: Self-pay

## 2020-09-13 ENCOUNTER — Ambulatory Visit (INDEPENDENT_AMBULATORY_CARE_PROVIDER_SITE_OTHER): Payer: Medicaid Other | Admitting: Certified Nurse Midwife

## 2020-09-13 VITALS — BP 113/72 | HR 85 | Wt 201.5 lb

## 2020-09-13 DIAGNOSIS — Z5941 Food insecurity: Secondary | ICD-10-CM

## 2020-09-13 DIAGNOSIS — O24410 Gestational diabetes mellitus in pregnancy, diet controlled: Secondary | ICD-10-CM | POA: Diagnosis not present

## 2020-09-13 DIAGNOSIS — Z3403 Encounter for supervision of normal first pregnancy, third trimester: Secondary | ICD-10-CM

## 2020-09-13 DIAGNOSIS — Z3A36 36 weeks gestation of pregnancy: Secondary | ICD-10-CM | POA: Insufficient documentation

## 2020-09-13 NOTE — Progress Notes (Signed)
Patient complains of daily pain and pressure in vagina when she walks

## 2020-09-13 NOTE — Patient Instructions (Signed)
Self-Care When you have gestational diabetes mellitus, you must make sure your blood sugar (glucose) stays at a healthy level. What are the risks? If you do not get treated for this condition, it may cause problems for you andyour unborn baby. For the mother Giving birth to the baby early. Having problems during labor and when giving birth. Needing surgery to give birth to the baby (cesarean delivery). Having problems with blood pressure. Getting this form of diabetes again when pregnant. Getting type 2 diabetes in the future. For the baby Low blood sugar. Bigger body size than is normal. Breathing problems. How to monitor blood sugar Check your blood sugar every day while you are pregnant. Check it as often as told by your doctor. To do this: Wash your hands with soap and water for at least 20 seconds. Prick the side of your finger (not the tip) with the lancet. Use a different finger each time. Gently rub the finger until a small drop of blood appears. Follow instructions that come with your meter for: Putting in the test strip. Putting blood on the strip. Getting the result. Write down your result and any notes. In general, your blood sugar levels should be: 95 mg/dL (5.3 mmol/L) if you have not eaten. 140 mg/dL (7.8 mmol/L) 1 hour after a meal. 120 mg/dL (6.7 mmol/L) 2 hours after a meal. Follow these instructions at home: Medicines Take over-the-counter and prescription medicines only as told by your doctor. If your doctor prescribed insulin or other diabetes medicines: Take them every day. Do not run out of insulin or other medicines. Plan ahead so you always have them. Eating and drinking  Follow instructions from your doctor about eating or drinking restrictions. See a food expert (dietician) to help you create an eating plan that helps control your blood sugar. The foods in this plan will include: Low-fat proteins. Dried beans, nuts, and whole grain breads, cereals,  or pasta. Fresh fruits and vegetables. Low-fat dairy products. Healthy fats. Eat healthy snacks between healthy meals. Drink enough fluid to keep your pee (urine) pale yellow. Keep track of carbs that you eat. To do this: Read food labels. Learn the serving sizes of foods. Follow your sick day plan when you cannot eat or drink normally. Make this plan with your doctor so it is ready to use.  Activity Do exercises as told by your doctor. Exercise for 30 or more minutes a day, or as much as your doctor recommends. To help you control blood sugar levels after a meal: Do 10 minutes of exercise after each meal. Start this exercise 30 minutes after the meal. Talk with your doctor before you start a new exercise. Your doctor may tell you to change your insulin, other medicines, or food. Lifestyle Do not drink alcohol. Do not use any products that contain nicotine or tobacco, such as cigarettes, e-cigarettes, and chewing tobacco. If you need help quitting, ask your doctor. Learn how to deal with stress. If you need help with this, ask your doctor. Body care Stay up to date with your shots (vaccines). Take good care of your teeth. To do this: Brush your teeth and gums two times a day. Floss one or more times a day. Go to the dentist one or more times every 6 months. Stay at a healthy weight while you are pregnant. General instructions Ask your doctor about risks of high blood pressure in pregnancy. Share your diabetes care plan with: Your work or school. People you live with.  Check your pee for ketones: When you are sick. As told by your doctor. Carry a card or wear a bracelet that says you have diabetes. Keep all follow-up visits. Care after giving birth Have your blood sugar checked 4-12 weeks after you give birth. Get checked for diabetes one or more times every 3 years or as told. Where to find more information American Diabetes Association (ADA): diabetes.org Association of  Diabetes Care & Education Specialists (ADCES): diabeteseducator.org Centers for Disease Control and Prevention (CDC): TonerPromos.no American Pregnancy Association: americanpregnancy.org U.[place]. Department of Agriculture MyPlate: WrestlingReporter.dk Contact a doctor if: Your blood sugar is above your target for two tests in a row. You have a fever. You are sick for 2 days or more and do not get better. You have either of these problems for more than 6 hours: You vomit every time you eat or drink. You have watery poop (diarrhea). Get help right away if: You cannot think clearly. You have trouble breathing. You have moderate or high ketones in your pee. Blood or abnormal fluid starts to come out of your vagina. You feel your baby is not moving as usual. You start having early contractions. You may feel your belly tighten. You have a very bad headache. These symptoms may be an emergency. Get help right away. Call your local emergency services (911 in the [place] Do not wait to see if the symptoms will go away. Do not drive yourself to the hospital. Summary Check your blood sugar (glucose) while you are pregnant. Check it as often as told by your doctor. Take your insulin and diabetes medicines as told. Have your blood sugar checked 4-12 weeks after you give birth. Keep all follow-up visits. This information is not intended to replace advice given to you by your health care provider. Make sure you discuss any questions you have with your healthcare provider. Document Revised: 08/08/2019 Document Revie

## 2020-09-13 NOTE — Progress Notes (Signed)
   PRENATAL VISIT NOTE  Subjective:  Lauren Aguilar is a 30 y.o. G1P0000 at [redacted]w[redacted]d being seen today for ongoing prenatal care.  She is currently monitored for the following issues for this low-risk pregnancy and has Cervical dysplasia; Supervision of low-risk first pregnancy; Obesity in pregnancy; BMI 33.0-33.9,adult; Alpha thalassemia silent carrier; Iron deficiency anemia of mother during pregnancy; and Gestational diabetes on their problem list.  Patient reports  vaginal pressure .  Contractions: Not present. Vag. Bleeding: None.  Movement: Present. Denies leaking of fluid.   The following portions of the patient's history were reviewed and updated as appropriate: allergies, current medications, past family history, past medical history, past social history, past surgical history and problem list.   Objective:   Vitals:   09/13/20 0839  BP: 113/72  Pulse: 85  Weight: 91.4 kg    Fetal Status: Fetal Heart Rate (bpm): 140 Fundal Height: 38 cm Movement: Present     General:  Alert, oriented and cooperative. Patient is in no acute distress.  Skin: Skin is warm and dry. No rash noted.   Cardiovascular: Normal heart rate noted  Respiratory: Normal respiratory effort, no problems with respiration noted  Abdomen: Soft, gravid, appropriate for gestational age.  Pain/Pressure: Present     Pelvic: Cervical exam deferred        Extremities: Normal range of motion.  Edema: None  Mental Status: Normal mood and affect. Normal behavior. Normal judgment and thought content.   Assessment and Plan:  Pregnancy: G1P0000 at [redacted]w[redacted]d 1. Supervision of low-risk first pregnancy, third trimester - Mairely is doing well.  - Complaints of pelvic pressure. Discussed normal 3rd trimester discomforts of pregnancy.    2. [redacted] weeks gestation of pregnancy - Culture, beta strep (group b only) - Cervicovaginal ancillary only( [place])  3. Food insecurity - AMBULATORY REFERRAL TO [facility]  4. Diet  controlled gestational diabetes mellitus (GDM) in third trimester - Fundal height measuring high normal.  - [place] MFM OB FOLLOW UP; Future  Preterm labor symptoms and general obstetric precautions including but not limited to vaginal bleeding, contractions, leaking of fluid and fetal movement were reviewed in detail with the patient. Please refer to After Visit Summary for other counseling recommendations.   Return in about 1 week (around 09/20/2020) for LROB.  No future appointments.  Signed:  P[place]ou[place] Schreiner Danella Deis) Suzie Portela, BSN, RNC-OB  Student Nurse-Midwife   09/13/2020  9:15 AM

## 2020-09-14 DIAGNOSIS — Z419 Encounter for procedure for purposes other than remedying health state, unspecified: Secondary | ICD-10-CM | POA: Diagnosis not present

## 2020-09-14 LAB — CERVICOVAGINAL ANCILLARY ONLY
Chlamydia: NEGATIVE
Comment: NEGATIVE
Comment: NORMAL
Neisseria Gonorrhea: NEGATIVE

## 2020-09-17 LAB — CULTURE, BETA STREP (GROUP B ONLY): Strep Gp B Culture: NEGATIVE

## 2020-09-20 ENCOUNTER — Ambulatory Visit (INDEPENDENT_AMBULATORY_CARE_PROVIDER_SITE_OTHER): Payer: Medicaid Other | Admitting: Obstetrics and Gynecology

## 2020-09-20 ENCOUNTER — Other Ambulatory Visit: Payer: Self-pay

## 2020-09-20 VITALS — BP 113/69 | HR 74 | Wt 203.1 lb

## 2020-09-20 DIAGNOSIS — O24410 Gestational diabetes mellitus in pregnancy, diet controlled: Secondary | ICD-10-CM

## 2020-09-20 DIAGNOSIS — D563 Thalassemia minor: Secondary | ICD-10-CM

## 2020-09-20 DIAGNOSIS — Z3A37 37 weeks gestation of pregnancy: Secondary | ICD-10-CM

## 2020-09-20 DIAGNOSIS — Z3403 Encounter for supervision of normal first pregnancy, third trimester: Secondary | ICD-10-CM

## 2020-09-20 NOTE — Progress Notes (Signed)
   PRENATAL VISIT NOTE  Subjective:  Lauren Aguilar is a 30 y.o. G1P0000 at [redacted]w[redacted]d being seen today for ongoing prenatal care.  She is currently monitored for the following issues for this low-risk pregnancy and has Cervical dysplasia; Supervision of low-risk first pregnancy; Obesity in pregnancy; BMI 33.0-33.9,adult; Alpha thalassemia silent carrier; Iron deficiency anemia of mother during pregnancy; Gestational diabetes; and [redacted] weeks gestation of pregnancy on their problem list.  Patient doing well with no acute concerns today. She reports no complaints.  Contractions: Not present. Vag. Bleeding: None.  Movement: Present. Denies leaking of fluid.   The following portions of the patient's history were reviewed and updated as appropriate: allergies, current medications, past family history, past medical history, past social history, past surgical history and problem list. Problem list updated.  Objective:   Vitals:   09/20/20 1651  BP: 113/69  Pulse: 74  Weight: 203 lb 1.6 oz (92.1 kg)    Fetal Status: Fetal Heart Rate (bpm): 147 Fundal Height: 38 cm Movement: Present     General:  Alert, oriented and cooperative. Patient is in no acute distress.  Skin: Skin is warm and dry. No rash noted.   Cardiovascular: Normal heart rate noted  Respiratory: Normal respiratory effort, no problems with respiration noted  Abdomen: Soft, gravid, appropriate for gestational age.  Pain/Pressure: Present     Pelvic: Cervical exam deferred        Extremities: Normal range of motion.  Edema: Trace  Mental Status:  Normal mood and affect. Normal behavior. Normal judgment and thought content.   Assessment and Plan:  Pregnancy: G1P0000 at [redacted]w[redacted]d  1. [redacted] weeks gestation of pregnancy   2. Diet controlled gestational diabetes mellitus (GDM) in third trimester Once diet was improved blood sugars are much better, most are within range - [place] MFM OB FOLLOW UP; Future  3. Alpha thalassemia silent carrier   4.  Encounter for supervision of low-risk first pregnancy in third trimester Continue routine care, consider IOL by 40 weeks  Term labor symptoms and general obstetric precautions including but not limited to vaginal bleeding, contractions, leaking of fluid and fetal movement were reviewed in detail with the patient.  Please refer to After Visit Summary for other counseling recommendations.   Return in about 1 week (around 09/27/2020) for LOB, in person.   Mariel Aloe, MD Faculty Attending Center for [facility]

## 2020-09-21 ENCOUNTER — Ambulatory Visit: Payer: Medicaid Other | Admitting: *Deleted

## 2020-09-21 ENCOUNTER — Encounter: Payer: Self-pay | Admitting: *Deleted

## 2020-09-21 ENCOUNTER — Ambulatory Visit: Payer: Medicaid Other | Attending: Certified Nurse Midwife

## 2020-09-21 VITALS — BP 113/61 | HR 84

## 2020-09-21 DIAGNOSIS — Z3A37 37 weeks gestation of pregnancy: Secondary | ICD-10-CM

## 2020-09-21 DIAGNOSIS — E669 Obesity, unspecified: Secondary | ICD-10-CM | POA: Diagnosis not present

## 2020-09-21 DIAGNOSIS — O99213 Obesity complicating pregnancy, third trimester: Secondary | ICD-10-CM | POA: Diagnosis not present

## 2020-09-21 DIAGNOSIS — Z148 Genetic carrier of other disease: Secondary | ICD-10-CM | POA: Diagnosis not present

## 2020-09-21 DIAGNOSIS — Z6833 Body mass index (BMI) 33.0-33.9, adult: Secondary | ICD-10-CM | POA: Insufficient documentation

## 2020-09-21 DIAGNOSIS — O24410 Gestational diabetes mellitus in pregnancy, diet controlled: Secondary | ICD-10-CM | POA: Insufficient documentation

## 2020-09-26 ENCOUNTER — Telehealth: Payer: Self-pay

## 2020-09-26 NOTE — Telephone Encounter (Signed)
Pt requested a call in regards to her being induced.  Called pt and pt informed me that she was told two different things about her not and to be induced.  I encouraged pt to have that discussion with the provider at her upcoming appt.  Pt reports that she does not have an appt.  Pt scheduled virtually tomorrow 09/27/20.  Pt also informed me that she has transportation concerns.  Pt provided with transportation service number.  I have filled out proper information and emailed to transportation service.  Pt will need to sign a rider waiver form.    Leonette Nutting  09/26/20

## 2020-09-26 NOTE — Telephone Encounter (Signed)
   Lauren Aguilar DOB: January 25, 1991 MRN: [id number]   RIDER WAIVER AND RELEASE OF LIABILITY  For purposes of improving physical access to our facilities, [facility] is pleased to partner with third parties to provide [place] patients or other authorized individuals the option of convenient, on-demand ground transportation services (the [employer]") through use of the technology service that enables users to request on-demand ground transportation from independent third-party providers.  By opting to use and accept these [employer], I, the undersigned, hereby agree on behalf of myself, and on behalf of any minor child using the Science writer for whom I am the parent or legal guardian, as follows:  Science writer provided to me are provided by independent third-party transportation providers who are not [employer] or employees and who are unaffiliated with [employer]. [place] is neither a transportation carrier nor a common or public carrier. [place] has no control over the quality or safety of the transportation that occurs as a result of the [employer]. [facility] cannot guarantee that any third-party transportation provider will complete any arranged transportation service. [facility] makes no representation, warranty, or guarantee regarding the reliability, timeliness, quality, safety, suitability, or availability of any of the Transport Services or that they will be error free. I fully understand that traveling by vehicle involves risks and dangers of serious bodily injury, including permanent disability, paralysis, and death. I agree, on behalf of myself and on behalf of any minor child using the Transport Services for whom I am the parent or legal guardian, that the entire risk arising out of my use of the [employer] remains solely with me, to the maximum extent permitted under applicable law. The [employer] are provided "as  is" and "as available." [facility] disclaims all representations and warranties, express, implied or statutory, not expressly set out in these terms, including the implied warranties of merchantability and fitness for a particular purpose. I hereby waive and release [facility], its agents, employees, officers, directors, representatives, insurers, attorneys, assigns, successors, subsidiaries, and affiliates from any and all past, present, or future claims, demands, liabilities, actions, causes of action, or suits of any kind directly or indirectly arising from acceptance and use of the [employer]. I further waive and release [facility] and its affiliates from all present and future liability and responsibility for any injury or death to persons or damages to property caused by or related to the use of the [employer]. I have read this Waiver and Release of Liability, and I understand the terms used in it and their legal significance. This Waiver is freely and voluntarily given with the understanding that my right (as well as the right of any minor child for whom I am the parent or legal guardian using the [employer]) to legal recourse against  in connection with the [employer] is knowingly surrendered in return for use of these services.   I attest that I read the consent document to Deneise Lever, gave Ms. Galeas the opportunity to ask questions and answered the questions asked (if any). I affirm that Deneise Lever then provided consent for she's participation in this program.     Farley Ly

## 2020-09-27 ENCOUNTER — Telehealth (INDEPENDENT_AMBULATORY_CARE_PROVIDER_SITE_OTHER): Payer: Medicaid Other | Admitting: Obstetrics and Gynecology

## 2020-09-27 ENCOUNTER — Encounter: Payer: Self-pay | Admitting: Obstetrics and Gynecology

## 2020-09-27 VITALS — BP 134/83 | HR 70

## 2020-09-27 DIAGNOSIS — D509 Iron deficiency anemia, unspecified: Secondary | ICD-10-CM

## 2020-09-27 DIAGNOSIS — E669 Obesity, unspecified: Secondary | ICD-10-CM

## 2020-09-27 DIAGNOSIS — O99213 Obesity complicating pregnancy, third trimester: Secondary | ICD-10-CM

## 2020-09-27 DIAGNOSIS — Z3A38 38 weeks gestation of pregnancy: Secondary | ICD-10-CM

## 2020-09-27 DIAGNOSIS — Z3403 Encounter for supervision of normal first pregnancy, third trimester: Secondary | ICD-10-CM

## 2020-09-27 DIAGNOSIS — O24410 Gestational diabetes mellitus in pregnancy, diet controlled: Secondary | ICD-10-CM

## 2020-09-27 DIAGNOSIS — O99210 Obesity complicating pregnancy, unspecified trimester: Secondary | ICD-10-CM

## 2020-09-27 DIAGNOSIS — O99013 Anemia complicating pregnancy, third trimester: Secondary | ICD-10-CM

## 2020-09-27 NOTE — Progress Notes (Signed)
I connected with  Lauren Aguilar on 09/27/20 at  1:35 PM EDT by telephone and verified that I am speaking with the correct person using two identifiers.   I discussed the limitations, risks, security and privacy concerns of performing an evaluation and management service by telephone and the availability of in person appointments. I also discussed with the patient that there may be a patient responsible charge related to this service. The patient expressed understanding and agreed to proceed.  Lauren Jarvis, RN 09/27/2020  1:40 PM  Pt at home  RN at [facility]  Time spent virtually was 3 mins

## 2020-09-27 NOTE — Progress Notes (Signed)
OBSTETRICS PRENATAL VIRTUAL VISIT ENCOUNTER NOTE  [facility]   Patient location: Home  I connected with Lauren Leverharisse Hain on 09/27/20 at  1:35 PM EDT by MyChart Video Encounter and verified that I am speaking with the correct person using two identifiers. I discussed the limitations, risks, security and privacy concerns of performing an evaluation and management service virtually and the availability of in person appointments. I also discussed with the patient that there may be a patient responsible charge related to this service. The patient expressed understanding and agreed to proceed. Subjective:  Lauren LeverCharisse Aguilar is a 30 y.o. G1P0000 at 2253w4d being seen today for ongoing prenatal care.  She is currently monitored for the following issues for this high-risk pregnancy and has Cervical dysplasia; Supervision of low-risk first pregnancy; Obesity in pregnancy; BMI 33.0-33.9,adult; Alpha thalassemia silent carrier; Iron deficiency anemia of mother during pregnancy; Gestational diabetes; and [redacted] weeks gestation of pregnancy on their problem list.  Patient reports no complaints.  Contractions: Not present. Vag. Bleeding: None.  Movement: Present. Denies any leaking of fluid.   The following portions of the patient's history were reviewed and updated as appropriate: allergies, current medications, past family history, past medical history, past social history, past surgical history and problem list.   Objective:   Vitals:   09/27/20 1340  BP: 134/83  Pulse: 70    Fetal Status:     Movement: Present     General:  Alert, oriented and cooperative. Patient is in no acute distress.  Respiratory: Normal respiratory effort, no problems with respiration noted  Mental Status: Normal mood and affect. Normal behavior. Normal judgment and thought content.  Rest of physical exam deferred due to type of encounter  Imaging: US MFM OB FOLLOW  UP  Result Date: 09/21/2020 ----------------------------------------------------------------------  OBSTETRICS REPORT                       (Signed Final 09/21/2020 09:55 am) ---------------------------------------------------------------------- Patient Info  ID #:       [id number]                          D.O.B.:  1990-08-29 (29 yrs)  Name:       Lauren Aguilar                  Visit Date: 09/21/2020 09:28 am ---------------------------------------------------------------------- Performed By  Attending:        Noralee Spaceavi Shankar MD        Ref. Address:     [address]                                                             [address]                                                             [place], [place]  [address]  Performed By:     Marcellina Millin          [facility]         [facility]                                     [facility]  Referred By:      [place] Bing MD ---------------------------------------------------------------------- Orders  #  Description                           Code        Ordered By  1  [place] MFM OB FOLLOW UP                   60454.09    JAMILLA WALKER ----------------------------------------------------------------------  #  Order #                     Accession #                Episode #  1  [id number]                   [id number]                 [id number] ---------------------------------------------------------------------- Indications  Gestational diabetes in pregnancy, diet        O24.410  controlled  [redacted] weeks gestation of pregnancy                Z3A.37  Alpha thalassemia silent carrier  Low risk NIPS  Obesity complicating pregnancy, third          O99.213  trimester ---------------------------------------------------------------------- Fetal  Evaluation  Num Of Fetuses:         1  Fetal Heart Rate(bpm):  155  Cardiac Activity:       Observed  Presentation:           Cephalic  Placenta:               Anterior  P. Cord Insertion:      Visualized  Amniotic Fluid  AFI FV:      Within normal limits  AFI Sum(cm)     %Tile       Largest Pocket(cm)  15.12           58          5.18  RUQ(cm)  RLQ(cm)       LUQ(cm)        LLQ(cm)  2.79          4.89          2.26           5.18 ---------------------------------------------------------------------- Biometry  BPD:      90.6  mm     G. Age:  36w 5d         43  %    CI:         75.6   %    70 - 86                                                          FL/HC:      21.2   %    20.9 - 22.7  HC:      330.4  mm     G. Age:  37w 4d         26  %    HC/AC:      0.90        0.92 - 1.05  AC:      367.8  mm     G. Age:  40w 5d       > 99  %    FL/BPD:     77.4   %    71 - 87  FL:       70.1  mm     G. Age:  36w 0d         12  %    FL/AC:      19.1   %    20 - 24  Est. FW:    3617  gm           8 lb     86  % ---------------------------------------------------------------------- OB History  Gravidity:    1 ---------------------------------------------------------------------- Gestational Age  LMP:           37w 5d        Date:  01/01/20                 EDD:   10/07/20  U/S Today:     37w 5d                                        EDD:   10/07/20  Best:          37w 5d     Det. By:  LMP  (01/01/20)          EDD:   10/07/20 ---------------------------------------------------------------------- Anatomy  Cranium:               Appears normal         LVOT:                   Appears normal  Cavum:                 Previously seen        Aortic Arch:            Previously seen  Ventricles:            Appears  normal         Ductal Arch:            Previously seen  Choroid Plexus:        Previously seen        Diaphragm:              Appears normal  Cerebellum:            Previously seen        Stomach:                Appears  normal, left                                                                        sided  Posterior Fossa:       Previously seen        Abdomen:                Appears normal  Nuchal Fold:           Previously seen        Abdominal Wall:         Previously seen  Face:                  Orbits and profile     Cord Vessels:           Appears normal (3                         previously seen                                vessel cord)  Lips:                  Previously seen        Kidneys:                Appear normal  Palate:                Not well visualized    Bladder:                Appears normal  Thoracic:              Appears normal         Spine:                  Previously seen  Heart:                 Appears normal         Upper Extremities:      Previously seen                         (4CH, axis, and                         situs)  RVOT:                  Previously seen        Lower Extremities:      Previously  seen  Other:  Heels/feet and open hands/5th digits previously visualized.          Technically difficult due to maternal habitus and fetal position. ---------------------------------------------------------------------- Cervix Uterus Adnexa  Cervix  Not visualized (advanced GA >24wks) ---------------------------------------------------------------------- Impression  Gestational diabetes. Well-controlled on diet .  Amniotic fluid is normal and good fetal activity is seen .Fetal  growth is appropriate for gestational age .Antenatal testing is  reassuring. BPP 8/8. ---------------------------------------------------------------------- Recommendations  -No follow-up appointments were made. ----------------------------------------------------------------------                  Noralee Space, MD Electronically Signed Final Report   09/21/2020 09:55 am ----------------------------------------------------------------------   Assessment and Plan:  Pregnancy: G1P0000 at [redacted]w[redacted]d 1. Encounter for supervision of  low-risk first pregnancy in third trimester Patient is doing well without complaints  2. Diet controlled gestational diabetes mellitus (GDM) in third trimester CBGs reviewed and all within range IOL scheduled at 40 weeks BPP 8/8 on 7/8 with EFW 3617 gm  3. Iron deficiency anemia of mother during pregnancy Continue iron supplement  4. Obesity in pregnancy Continue ASA  Term labor symptoms and general obstetric precautions including but not limited to vaginal bleeding, contractions, leaking of fluid and fetal movement were reviewed in detail with the patient. I discussed the assessment and treatment plan with the patient. The patient was provided an opportunity to ask questions and all were answered. The patient agreed with the plan and demonstrated an understanding of the instructions. The patient was advised to call back or seek an in-person office evaluation/go to [facility] at [facility] for any urgent or concerning symptoms. Please refer to After Visit Summary for other counseling recommendations.   I provided 15 minutes of face-to-face time during this encounter.  Return in about 1 week (around 10/04/2020) for Virtual, ROB, High risk.  No future appointments.  Catalina Antigua, MD Center for [employer], [facility]

## 2020-09-29 ENCOUNTER — Other Ambulatory Visit: Payer: Self-pay | Admitting: Advanced Practice Midwife

## 2020-10-01 ENCOUNTER — Telehealth (HOSPITAL_COMMUNITY): Payer: Self-pay | Admitting: *Deleted

## 2020-10-01 NOTE — Telephone Encounter (Signed)
Preadmission screen  

## 2020-10-02 ENCOUNTER — Telehealth (HOSPITAL_COMMUNITY): Payer: Self-pay | Admitting: *Deleted

## 2020-10-02 ENCOUNTER — Encounter (HOSPITAL_COMMUNITY): Payer: Self-pay | Admitting: *Deleted

## 2020-10-02 NOTE — Telephone Encounter (Signed)
Preadmission screen  

## 2020-10-05 ENCOUNTER — Telehealth: Payer: Self-pay | Admitting: Family Medicine

## 2020-10-05 ENCOUNTER — Other Ambulatory Visit (HOSPITAL_COMMUNITY)
Admission: RE | Admit: 2020-10-05 | Discharge: 2020-10-05 | Disposition: A | Discharge status: Home / Self Care | Payer: Medicaid Other | Source: Ambulatory Visit | Attending: Obstetrics and Gynecology | Admitting: Obstetrics and Gynecology

## 2020-10-05 DIAGNOSIS — Z20822 Contact with and (suspected) exposure to covid-19: Secondary | ICD-10-CM | POA: Insufficient documentation

## 2020-10-05 DIAGNOSIS — Z01812 Encounter for preprocedural laboratory examination: Secondary | ICD-10-CM | POA: Insufficient documentation

## 2020-10-05 LAB — SARS CORONAVIRUS 2 (TAT 6-24 HRS): SARS Coronavirus 2: NEGATIVE

## 2020-10-05 NOTE — Telephone Encounter (Signed)
Per chart review has IOL scheduled 10/07/20 . I called Tanajah back and informed her we usually do not give a letter for being out of work until after delivery ; so call or send message back Monday after she has delivered. I informed her depending on her work we may be able to release her back to work early. 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 She voices understanding and will reach back out to [place] after she has delivered.  Gilbert Narain,RN

## 2020-10-05 NOTE — Telephone Encounter (Signed)
Patient is requesting a letter stating she can be out of work from 10-08-2020 to 10-22-2020

## 2020-10-05 NOTE — Telephone Encounter (Signed)
Patient also left a voice message she needs a nurse to call  her back about a general question.  Elliott Quade,RN

## 2020-10-06 ENCOUNTER — Inpatient Hospital Stay (HOSPITAL_COMMUNITY)
Admission: AD | Admit: 2020-10-06 | Discharge: 2020-10-08 | DRG: 807 | Disposition: A | Discharge status: Home / Self Care | Payer: Medicaid Other | Attending: Obstetrics and Gynecology | Admitting: Obstetrics and Gynecology

## 2020-10-06 ENCOUNTER — Encounter (HOSPITAL_COMMUNITY): Payer: Self-pay | Admitting: Obstetrics and Gynecology

## 2020-10-06 ENCOUNTER — Other Ambulatory Visit: Payer: Self-pay

## 2020-10-06 DIAGNOSIS — Z3A39 39 weeks gestation of pregnancy: Secondary | ICD-10-CM

## 2020-10-06 DIAGNOSIS — Z20822 Contact with and (suspected) exposure to covid-19: Secondary | ICD-10-CM | POA: Diagnosis not present

## 2020-10-06 DIAGNOSIS — O9902 Anemia complicating childbirth: Secondary | ICD-10-CM | POA: Diagnosis not present

## 2020-10-06 DIAGNOSIS — Z3490 Encounter for supervision of normal pregnancy, unspecified, unspecified trimester: Secondary | ICD-10-CM | POA: Diagnosis present

## 2020-10-06 DIAGNOSIS — O99214 Obesity complicating childbirth: Secondary | ICD-10-CM | POA: Diagnosis present

## 2020-10-06 DIAGNOSIS — D509 Iron deficiency anemia, unspecified: Secondary | ICD-10-CM | POA: Diagnosis present

## 2020-10-06 DIAGNOSIS — D563 Thalassemia minor: Secondary | ICD-10-CM | POA: Diagnosis present

## 2020-10-06 DIAGNOSIS — O24420 Gestational diabetes mellitus in childbirth, diet controlled: Secondary | ICD-10-CM | POA: Diagnosis not present

## 2020-10-06 DIAGNOSIS — O24419 Gestational diabetes mellitus in pregnancy, unspecified control: Secondary | ICD-10-CM | POA: Diagnosis present

## 2020-10-06 DIAGNOSIS — Z3A40 40 weeks gestation of pregnancy: Secondary | ICD-10-CM | POA: Diagnosis not present

## 2020-10-06 DIAGNOSIS — O24410 Gestational diabetes mellitus in pregnancy, diet controlled: Secondary | ICD-10-CM | POA: Diagnosis not present

## 2020-10-06 DIAGNOSIS — Z3403 Encounter for supervision of normal first pregnancy, third trimester: Secondary | ICD-10-CM

## 2020-10-06 HISTORY — DX: Anemia, unspecified: D64.9

## 2020-10-06 LAB — GLUCOSE, CAPILLARY
Glucose-Capillary: 133 mg/dL — ABNORMAL HIGH (ref 70–99)
Glucose-Capillary: 76 mg/dL (ref 70–99)
Glucose-Capillary: 93 mg/dL (ref 70–99)

## 2020-10-06 LAB — CBC
HCT: 37.6 % (ref 36.0–46.0)
Hemoglobin: 12 g/dL (ref 12.0–15.0)
MCH: 26 pg (ref 26.0–34.0)
MCHC: 31.9 g/dL (ref 30.0–36.0)
MCV: 81.6 fL (ref 80.0–100.0)
Platelets: 264 10*3/uL (ref 150–400)
RBC: 4.61 MIL/uL (ref 3.87–5.11)
RDW: 16.8 % — ABNORMAL HIGH (ref 11.5–15.5)
WBC: 12.8 10*3/uL — ABNORMAL HIGH (ref 4.0–10.5)
nRBC: 0 % (ref 0.0–0.2)

## 2020-10-06 LAB — TYPE AND SCREEN
ABO/RH(D): O POS
Antibody Screen: NEGATIVE

## 2020-10-06 MED ORDER — OXYCODONE-ACETAMINOPHEN 5-325 MG PO TABS
1.0000 | ORAL_TABLET | ORAL | Status: DC | PRN
  Administered 2020-10-07: 1 via ORAL
  Filled 2020-10-06: qty 1

## 2020-10-06 MED ORDER — OXYTOCIN BOLUS FROM INFUSION
333.0000 mL | Freq: Once | INTRAVENOUS | Status: AC
  Administered 2020-10-07: 333 mL via INTRAVENOUS

## 2020-10-06 MED ORDER — SOD CITRATE-CITRIC ACID 500-334 MG/5ML PO SOLN: 30.0000 mL | ORAL | Status: DC | PRN

## 2020-10-06 MED ORDER — LIDOCAINE HCL (PF) 1 % IJ SOLN: 30.0000 mL | INTRAMUSCULAR | Status: DC | PRN

## 2020-10-06 MED ORDER — ACETAMINOPHEN 325 MG PO TABS: 650.0000 mg | ORAL_TABLET | ORAL | Status: DC | PRN

## 2020-10-06 MED ORDER — FENTANYL CITRATE (PF) 100 MCG/2ML IJ SOLN
100.0000 ug | INTRAMUSCULAR | Status: DC | PRN
  Administered 2020-10-06 – 2020-10-07 (×3): 100 ug via INTRAVENOUS
  Filled 2020-10-06 (×3): qty 2

## 2020-10-06 MED ORDER — LACTATED RINGERS IV SOLN: INTRAVENOUS | Status: DC

## 2020-10-06 MED ORDER — OXYCODONE-ACETAMINOPHEN 5-325 MG PO TABS: 2.0000 | ORAL_TABLET | ORAL | Status: DC | PRN

## 2020-10-06 MED ORDER — LACTATED RINGERS IV SOLN: 500.0000 mL | INTRAVENOUS | Status: DC | PRN

## 2020-10-06 MED ORDER — ONDANSETRON HCL 4 MG/2ML IJ SOLN: 4.0000 mg | Freq: Four times a day (QID) | INTRAMUSCULAR | Status: DC | PRN

## 2020-10-06 MED ORDER — OXYTOCIN-SODIUM CHLORIDE 30-0.9 UT/500ML-% IV SOLN
2.5000 [IU]/h | INTRAVENOUS | Status: DC
  Administered 2020-10-07: 2.5 [IU]/h via INTRAVENOUS
  Filled 2020-10-06: qty 500

## 2020-10-06 NOTE — H&P (Signed)
OBSTETRIC ADMISSION HISTORY AND PHYSICAL  Lauren Aguilar is a 30 y.o. female G1P0000 with IUP at [redacted]w[redacted]d by LMP presenting for early labor, NRFHT. She reports +FMs, No LOF, no VB, no blurry vision, headaches or peripheral edema, and RUQ pain.  She plans on exclusively pumping feeding. She is unsure what she desires for birth control. She received her prenatal care at [facility]   Dating: By LMP --->  Estimated Date of Delivery: 10/07/20  Sono:    @[redacted]w[redacted]d , CWD, normal anatomy, cephalic presentation, 3617g, EFW   Prenatal History/Complications: A1GDM  Past Medical History: Past Medical History:  Diagnosis Date   Anemia    Gestational diabetes    Vaginal Pap smear, abnormal     Past Surgical History: Past Surgical History:  Procedure Laterality Date   COLPOSCOPY  06/11/2020       WISDOM TOOTH EXTRACTION      Obstetrical History: OB History     Gravida  1   Para  0   Term  0   Preterm  0   AB  0   Living  0      SAB  0   IAB  0   Ectopic  0   Multiple  0   Live Births  0           Social History Social History   Socioeconomic History   Marital status: Single    Spouse name: Not on file   Number of children: Not on file   Years of education: Not on file   Highest education level: Not on file  Occupational History   Not on file  Tobacco Use   Smoking status: Never   Smokeless tobacco: Never  Vaping Use   Vaping Use: Never used  Substance and Sexual Activity   Alcohol use: Not Currently    Comment: RARE once a week on fridays.   Drug use: Never   Sexual activity: Not Currently  Other Topics Concern   Not on file  Social History Narrative   ** Merged History Encounter **       Social Determinants of Health   Financial Resource Strain: Not on file  Food Insecurity: Food Insecurity Present   Worried About 06/13/2020 in the Last Year: Sometimes true   Ran Out of Food in the Last Year: Sometimes true  Transportation Needs: No  Transportation Needs   Lack of Transportation (Medical): No   Lack of Transportation (Non-Medical): No  Physical Activity: Not on file  Stress: Not on file  Social Connections: Not on file    Family History: Family History  Problem Relation Age of Onset   Diabetes Father    Hypertension Father    Breast cancer Paternal Grandmother    Intellectual disability Paternal Grandmother    Diabetes Paternal Grandmother    Diabetes Mother    Hypertension Mother    Colon cancer Paternal Grandfather     Allergies: Allergies  Allergen Reactions   Amoxicillin Itching and Rash    Medications Prior to Admission  Medication Sig Dispense Refill Last Dose   FERROUS SULFATE PO Take by mouth.   10/05/2020   prenatal vitamin w/FE, FA (PRENATAL 1 + 1) 27-1 MG TABS tablet Take 1 tablet by mouth daily at 12 noon. 30 tablet 11 10/05/2020   Accu-Chek Softclix Lancets lancets To check blood sugars 4 times a day. Fasting and 2 hours after breakfast, lunch or dinner. 100 each 11    glucose  blood (ACCU-CHEK GUIDE) test strip To check blood sugars 4 times a day. Fasting and 2 hours after breakfast, lunch and dinner. 100 each 11    Review of Systems   All systems reviewed and negative except as stated in HPI  Blood pressure 115/72, pulse 95, temperature 98.7 F (37.1 C), temperature source Oral, resp. rate 18, last menstrual period 01/01/2020, SpO2 100 %. General appearance: alert, cooperative, and no distress Lungs: clear to auscultation bilaterally Heart: regular rate and rhythm Abdomen: soft, non-tender; bowel sounds normal Pelvic: n/a Extremities: Homans sign is negative, no sign of DVT DTR's +2 Presentation: cephalic Fetal monitoringBaseline: 150 bpm, Variability: minimal to moderate, Accelerations: Reactive, and Decelerations: Absent Uterine activityFrequency: Every 2-4 minutes Dilation: 3 Effacement (%): 60 Station: -3 Exam by:: Johnanna Schneiders RN   Prenatal labs: ABO, Rh: O/Positive/--  (01/12 1516) Antibody: Negative (01/12 1516) Rubella: 6.83 (01/12 1516) RPR: Non Reactive (04/25 0840)  HBsAg: Negative (01/12 1516)  HIV: Non Reactive (04/25 0840)  GBS: Negative/-- (06/30 1021)    Prenatal Transfer Tool  Maternal Diabetes: Yes:  Diabetes Type:  Diet controlled Genetic Screening: Normal Maternal Ultrasounds/Referrals: Normal Fetal Ultrasounds or other Referrals:  Referred to Materal Fetal Medicine  Maternal Substance Abuse:  No Significant Maternal Medications:  None Significant Maternal Lab Results: Group B Strep negative  No results found for this or any previous visit (from the past 24 hour(s)).  Patient Active Problem List   Diagnosis Date Noted   [redacted] weeks gestation of pregnancy 09/20/2020   Iron deficiency anemia of mother during pregnancy 07/10/2020   Gestational diabetes 07/10/2020   Alpha thalassemia silent carrier 04/26/2020   Supervision of low-risk first pregnancy 03/28/2020   Obesity in pregnancy 03/28/2020   BMI 33.0-33.9,adult 03/28/2020   Cervical dysplasia 12/17/2015    Assessment/Plan:  Lauren Aguilar is a 30 y.o. G1P0000 at [redacted]w[redacted]d here for early labor, NRFHT. A1GDM  #Labor: expectant management for now, consider pitocin if unchanged with next exam #Pain: Per patient request #FWB: Cat 2 #ID:  GBS neg #MOF: Breast #MOC: unsure #Circ:  N/A  Rolm Bookbinder, CNM  10/06/2020, 12:28 PM

## 2020-10-06 NOTE — MAU Note (Signed)
Patient came into MAU at [redacted]w[redacted]d gestation with c/o contractions that started at 2000 last night and getting more intense, patient states about every 5 minutes. Patient denies having any vaginal bleeding, but reports losing her mucous plug. Patient is feeling fetal movement.

## 2020-10-07 ENCOUNTER — Inpatient Hospital Stay (HOSPITAL_COMMUNITY): Payer: Medicaid Other | Admitting: Anesthesiology

## 2020-10-07 ENCOUNTER — Inpatient Hospital Stay (HOSPITAL_COMMUNITY)
Admission: AD | Admit: 2020-10-07 | Payer: Medicaid Other | Source: Home / Self Care | Admitting: Obstetrics and Gynecology

## 2020-10-07 ENCOUNTER — Inpatient Hospital Stay (HOSPITAL_COMMUNITY): Payer: Medicaid Other

## 2020-10-07 ENCOUNTER — Encounter (HOSPITAL_COMMUNITY): Payer: Self-pay | Admitting: *Deleted

## 2020-10-07 DIAGNOSIS — O24429 Gestational diabetes mellitus in childbirth, unspecified control: Secondary | ICD-10-CM | POA: Diagnosis not present

## 2020-10-07 DIAGNOSIS — Z3A40 40 weeks gestation of pregnancy: Secondary | ICD-10-CM | POA: Diagnosis not present

## 2020-10-07 DIAGNOSIS — O24420 Gestational diabetes mellitus in childbirth, diet controlled: Secondary | ICD-10-CM

## 2020-10-07 LAB — GLUCOSE, CAPILLARY
Glucose-Capillary: 69 mg/dL — ABNORMAL LOW (ref 70–99)
Glucose-Capillary: 75 mg/dL (ref 70–99)
Glucose-Capillary: 76 mg/dL (ref 70–99)
Glucose-Capillary: 84 mg/dL (ref 70–99)
Glucose-Capillary: 90 mg/dL (ref 70–99)

## 2020-10-07 LAB — RPR: RPR Ser Ql: NONREACTIVE

## 2020-10-07 MED ORDER — SIMETHICONE 80 MG PO CHEW: 80.0000 mg | CHEWABLE_TABLET | ORAL | Status: DC | PRN

## 2020-10-07 MED ORDER — BENZOCAINE-MENTHOL 20-0.5 % EX AERO: 1.0000 "application " | INHALATION_SPRAY | CUTANEOUS | Status: DC | PRN

## 2020-10-07 MED ORDER — FENTANYL-BUPIVACAINE-NACL 0.5-0.125-0.9 MG/250ML-% EP SOLN
EPIDURAL | Status: DC | PRN
  Administered 2020-10-07: 12 mL/h via EPIDURAL

## 2020-10-07 MED ORDER — EPHEDRINE 5 MG/ML INJ: 10.0000 mg | INTRAVENOUS | Status: DC | PRN

## 2020-10-07 MED ORDER — PRENATAL MULTIVITAMIN CH
1.0000 | ORAL_TABLET | Freq: Every day | ORAL | Status: DC
  Administered 2020-10-08: 1 via ORAL
  Filled 2020-10-07: qty 1

## 2020-10-07 MED ORDER — PHENYLEPHRINE 40 MCG/ML (10ML) SYRINGE FOR IV PUSH (FOR BLOOD PRESSURE SUPPORT): 80.0000 ug | PREFILLED_SYRINGE | INTRAVENOUS | Status: DC | PRN

## 2020-10-07 MED ORDER — WITCH HAZEL-GLYCERIN EX PADS: 1.0000 "application " | MEDICATED_PAD | CUTANEOUS | Status: DC | PRN

## 2020-10-07 MED ORDER — METHYLERGONOVINE MALEATE 0.2 MG/ML IJ SOLN
INTRAMUSCULAR | Status: AC
  Administered 2020-10-07: 0.2 mg
  Filled 2020-10-07: qty 1

## 2020-10-07 MED ORDER — DIBUCAINE (PERIANAL) 1 % EX OINT: 1.0000 "application " | TOPICAL_OINTMENT | CUTANEOUS | Status: DC | PRN

## 2020-10-07 MED ORDER — COCONUT OIL OIL: 1.0000 "application " | TOPICAL_OIL | Status: DC | PRN

## 2020-10-07 MED ORDER — TETANUS-DIPHTH-ACELL PERTUSSIS 5-2.5-18.5 LF-MCG/0.5 IM SUSY: 0.5000 mL | PREFILLED_SYRINGE | Freq: Once | INTRAMUSCULAR | Status: DC

## 2020-10-07 MED ORDER — ZOLPIDEM TARTRATE 5 MG PO TABS: 5.0000 mg | ORAL_TABLET | Freq: Every evening | ORAL | Status: DC | PRN

## 2020-10-07 MED ORDER — EPHEDRINE 5 MG/ML INJ
10.0000 mg | INTRAVENOUS | Status: DC | PRN
Start: 2020-10-07 — End: 2020-10-07

## 2020-10-07 MED ORDER — DIPHENHYDRAMINE HCL 50 MG/ML IJ SOLN
12.5000 mg | INTRAMUSCULAR | Status: DC | PRN
Start: 2020-10-07 — End: 2020-10-07

## 2020-10-07 MED ORDER — LACTATED RINGERS IV SOLN: 500.0000 mL | Freq: Once | INTRAVENOUS | Status: DC

## 2020-10-07 MED ORDER — SENNOSIDES-DOCUSATE SODIUM 8.6-50 MG PO TABS
2.0000 | ORAL_TABLET | Freq: Every day | ORAL | Status: DC
  Administered 2020-10-08: 2 via ORAL
  Filled 2020-10-07: qty 2

## 2020-10-07 MED ORDER — ONDANSETRON HCL 4 MG PO TABS: 4.0000 mg | ORAL_TABLET | ORAL | Status: DC | PRN

## 2020-10-07 MED ORDER — IBUPROFEN 600 MG PO TABS
600.0000 mg | ORAL_TABLET | Freq: Four times a day (QID) | ORAL | Status: DC
  Administered 2020-10-08 (×4): 600 mg via ORAL
  Filled 2020-10-07 (×4): qty 1

## 2020-10-07 MED ORDER — LIDOCAINE HCL (PF) 1 % IJ SOLN
INTRAMUSCULAR | Status: DC | PRN
  Administered 2020-10-07: 10 mL via EPIDURAL
  Administered 2020-10-07: 2 mL via EPIDURAL

## 2020-10-07 MED ORDER — DIPHENHYDRAMINE HCL 25 MG PO CAPS: 25.0000 mg | ORAL_CAPSULE | Freq: Four times a day (QID) | ORAL | Status: DC | PRN

## 2020-10-07 MED ORDER — ONDANSETRON HCL 4 MG/2ML IJ SOLN: 4.0000 mg | INTRAMUSCULAR | Status: DC | PRN

## 2020-10-07 MED ORDER — ACETAMINOPHEN 325 MG PO TABS: 650.0000 mg | ORAL_TABLET | ORAL | Status: DC | PRN

## 2020-10-07 MED ORDER — FENTANYL-BUPIVACAINE-NACL 0.5-0.125-0.9 MG/250ML-% EP SOLN
12.0000 mL/h | EPIDURAL | Status: DC | PRN
  Filled 2020-10-07: qty 250

## 2020-10-07 NOTE — Discharge Summary (Signed)
Postpartum Discharge Summary  Date of Service updated-yes     Patient Name: Lauren Aguilar DOB: 01-Jul-1990 MRN: [id number]  Date of admission: 10/06/2020 Delivery date:10/07/2020  Delivering provider: Genia Del  Date of discharge: 10/08/2020  Admitting diagnosis: Encounter for induction of labor [Z34.90] Intrauterine pregnancy: [redacted]w[redacted]d    Secondary diagnosis:  Active Problems:   Gestational diabetes   Encounter for induction of labor   Vaginal delivery   Shoulder dystocia, delivered  Additional problems: None    Discharge diagnosis: Term Pregnancy Delivered and GDM A1                                              Post partum procedures: None  Augmentation: AROM Complications: None  Hospital course: Onset of Labor With Vaginal Delivery      30y.o. yo G1P0000 at 44w0das admitted in Latent Labor on 10/06/2020. Her labor course was complicated by a shoulder dystocia that lasted 30 seconds in duration.  Membrane Rupture Time/Date: 11:31 AM ,10/07/2020   Delivery Method:Vaginal, Spontaneous  Episiotomy: None  Lacerations:  None  Patient had an uncomplicated postpartum course. She is ambulating, tolerating a regular diet, passing flatus, and urinating well. Patient is discharged home in stable condition on 10/08/20.  Newborn Data: Birth date:10/07/2020  Birth time:5:43 PM  Gender:Female  Living status:Living  Apgars:7 ,9  Weight:3909 g  8'9  Magnesium Sulfate received: No BMZ received: No Rhophylac:N/A MMR:N/A T-DaP:Given prenatally Flu: No Transfusion:No  Physical exam  Vitals:   10/07/20 2225 10/08/20 0241 10/08/20 0619 10/08/20 1145  BP: 113/64 (!) 107/57 108/66 106/65  Pulse: 76 69 73 65  Resp: 16     Temp: 98 F (36.7 C) 98.2 F (36.8 C) 97.8 F (36.6 C) 98 F (36.7 C)  TempSrc: Oral Oral Oral Oral  SpO2: 99% 100% 97%   Weight:      Height:       General: alert, cooperative, and no distress Lochia: appropriate Uterine Fundus:  firm Incision: N/A DVT Evaluation: No evidence of DVT seen on physical exam. Negative Homan's sign. No cords or calf tenderness. No significant calf/ankle edema. Labs: Lab Results  Component Value Date   WBC 12.8 (H) 10/06/2020   HGB 12.0 10/06/2020   HCT 37.6 10/06/2020   MCV 81.6 10/06/2020   PLT 264 10/06/2020   CMP Latest Ref Rng & Units 03/28/2020  Glucose 65 - 99 mg/dL 87  BUN 6 - 20 mg/dL 4(L)  Creatinine 0.57 - 1.00 mg/dL 0.60  Sodium 134 - 144 mmol/L 134  Potassium 3.5 - 5.2 mmol/L 3.9  Chloride 96 - 106 mmol/L 100  CO2 20 - 29 mmol/L 21  Calcium 8.7 - 10.2 mg/dL 9.8  Total Protein 6.0 - 8.5 g/dL 7.0  Total Bilirubin 0.0 - 1.2 mg/dL <0.2  Alkaline Phos 44 - 121 IU/L 65  AST 0 - 40 IU/L 14  ALT 0 - 32 IU/L 11   Edinburgh Score: Edinburgh Postnatal Depression Scale Screening Tool 10/08/2020  I have been able to laugh and see the funny side of things. 0  I have looked forward with enjoyment to things. 0  I have blamed myself unnecessarily when things went wrong. 0  I have been anxious or worried for no good reason. 0  I have felt scared or panicky for no good reason. 0  Things  have been getting on top of me. 0  I have been so unhappy that I have had difficulty sleeping. 0  I have felt sad or miserable. 0  I have been so unhappy that I have been crying. 0  The thought of harming myself has occurred to me. 0  Edinburgh Postnatal Depression Scale Total 0     After visit meds:  Allergies as of 10/08/2020       Reactions   Amoxicillin Itching, Rash        Medication List     STOP taking these medications    Accu-Chek Guide test strip Generic drug: glucose blood   Accu-Chek Softclix Lancets lancets   FERROUS SULFATE PO       TAKE these medications    ibuprofen 600 MG tablet Commonly known as: ADVIL Take 1 tablet (600 mg total) by mouth every 6 (six) hours.   prenatal vitamin w/FE, FA 27-1 MG Tabs tablet Take 1 tablet by mouth daily at 12  noon.         Discharge home in stable condition Infant Feeding: Bottle and Breast Infant Disposition:home with mother Discharge instruction: per After Visit Summary and Postpartum booklet. Activity: Advance as tolerated. Pelvic rest for 6 weeks.  Diet: routine diet Future Appointments: Future Appointments  Date Time Provider [place]  11/14/2020  9:35 AM Gabriel Carina, CNM [facility]   Follow up Visit:  Please schedule this patient for a In person postpartum visit in 6 weeks with the following provider: Any provider. Additional Postpartum F/U:2 hour GTT  High risk pregnancy complicated by: GDM Delivery mode:  Vaginal, Spontaneous  Anticipated Birth Control:  Unsure   10/08/2020 Julianne Handler, CNM

## 2020-10-07 NOTE — Anesthesia Procedure Notes (Signed)
Epidural Patient location during procedure: OB Start time: 10/07/2020 7:24 AM End time: 10/07/2020 7:35 AM  Staffing Anesthesiologist: Lannie Fields, DO Performed: anesthesiologist   Preanesthetic Checklist Completed: patient identified, IV checked, risks and benefits discussed, monitors and equipment checked, pre-op evaluation and timeout performed  Epidural Patient position: sitting Prep: DuraPrep and site prepped and draped Patient monitoring: continuous pulse ox, blood pressure, heart rate and cardiac monitor Approach: midline Location: L3-L4 Injection technique: LOR air  Needle:  Needle type: Tuohy  Needle gauge: 17 G Needle length: 9 cm Needle insertion depth: 7 cm Catheter type: closed end flexible Catheter size: 19 Gauge Catheter at skin depth: 12 cm Test dose: negative  Assessment Sensory level: T8 Events: blood not aspirated, injection not painful, no injection resistance, no paresthesia and negative IV test  Additional Notes Patient identified. Risks/Benefits/Options discussed with patient including but not limited to bleeding, infection, nerve damage, paralysis, failed block, incomplete pain control, headache, blood pressure changes, nausea, vomiting, reactions to medication both or allergic, itching and postpartum back pain. Confirmed with bedside nurse the patient's most recent platelet count. Confirmed with patient that they are not currently taking any anticoagulation, have any bleeding history or any family history of bleeding disorders. Patient expressed understanding and wished to proceed. All questions were answered. Sterile technique was used throughout the entire procedure. Please see nursing notes for vital signs. Test dose was given through epidural catheter and negative prior to continuing to dose epidural or start infusion. Warning signs of high block given to the patient including shortness of breath, tingling/numbness in hands, complete motor  block, or any concerning symptoms with instructions to call for help. Patient was given instructions on fall risk and not to get out of bed. All questions and concerns addressed with instructions to call with any issues or inadequate analgesia.  Reason for block:procedure for pain

## 2020-10-07 NOTE — Progress Notes (Signed)
Lauren Aguilar is a 30 y.o. G1P0000 at [redacted]w[redacted]d by LMP admitted for early labor and NRFHT.  Subjective: Patient awake, but resting. Comfortable after Fentanyl injection. Mother and FOB supporting at bedside.  Objective: BP (!) 128/92   Pulse 74   Temp 98.7 F (37.1 C) (Oral)   Resp 16   Ht 5\' 4"  (1.626 m)   Wt 92.1 kg   LMP 01/01/2020   SpO2 100%   BMI 34.84 kg/m  No intake/output data recorded. No intake/output data recorded.  FHT:  FHR: 140 bpm, variability: minimal ,  accelerations:  Present,  decelerations:  Absent UC:   regular, every 2-5 minutes SVE:   Dilation: 4 Effacement (%): 70 Station: -3 Exam by:: [email or web address] RN  Labs: Lab Results  Component Value Date   WBC 12.8 (H) 10/06/2020   HGB 12.0 10/06/2020   HCT 37.6 10/06/2020   MCV 81.6 10/06/2020   PLT 264 10/06/2020    Assessment / Plan: Spontaneous labor, progressing normally  Labor: Progressing normally Preeclampsia:   n/a Fetal Wellbeing:  Category I Pain Control:  Labor support without medications I/D:  n/a Anticipated MOD:  NSVD  Aldrich Lloyd 10/07/2020, 12:08 AM

## 2020-10-07 NOTE — Anesthesia Preprocedure Evaluation (Signed)
Anesthesia Evaluation  Patient identified by MRN, date of birth, ID band Patient awake    Reviewed: Allergy & Precautions, Patient's Chart, lab work & pertinent test results  Airway Mallampati: II  TM Distance: >3 FB Neck ROM: Full    Dental no notable dental hx.    Pulmonary neg pulmonary ROS,    Pulmonary exam normal breath sounds clear to auscultation       Cardiovascular negative cardio ROS Normal cardiovascular exam Rhythm:Regular Rate:Normal     Neuro/Psych negative neurological ROS  negative psych ROS   GI/Hepatic negative GI ROS, Neg liver ROS,   Endo/Other  diabetes, GestationalObesity BMI 35  Renal/GU negative Renal ROS  negative genitourinary   Musculoskeletal negative musculoskeletal ROS (+)   Abdominal   Peds negative pediatric ROS (+)  Hematology negative hematology ROS (+) hct 37.6, plt 264   Anesthesia Other Findings   Reproductive/Obstetrics (+) Pregnancy                             Anesthesia Physical Anesthesia Plan  ASA: 2  Anesthesia Plan: Epidural   Post-op Pain Management:    Induction:   PONV Risk Score and Plan: 2  Airway Management Planned: Natural Airway  Additional Equipment: None  Intra-op Plan:   Post-operative Plan:   Informed Consent: I have reviewed the patients History and Physical, chart, labs and discussed the procedure including the risks, benefits and alternatives for the proposed anesthesia with the patient or authorized representative who has indicated his/her understanding and acceptance.       Plan Discussed with:   Anesthesia Plan Comments:         Anesthesia Quick Evaluation

## 2020-10-07 NOTE — Progress Notes (Signed)
Labor Progress Note Journe Hallmark is a 30 y.o. G1P0000 at [redacted]w[redacted]d presented for SOL.  S: Patient is doing well overall. Pain is well-controlled. No concerns at this time.  O:  BP 119/74   Pulse 73   Temp 98.6 F (37 C) (Oral)   Resp 16   Ht 5\' 4"  (1.626 m)   Wt 92.1 kg   LMP 01/01/2020   SpO2 100%   BMI 34.84 kg/m  EFM: Baseline 120 bpm/ moderate variability/ + accels/ no decels  CVE: Dilation: Lip/rim Effacement (%): 100 Cervical Position: Middle Station: 0 Presentation: Vertex Exam by:: Dr. [email or web address]   A&P: 30 y.o. G1P0000 [redacted]w[redacted]d with hx of A1DM presenting for SOL.   #Labor: Progressing well. AROM performed at 1131 with clear fluid. Mom and baby tolerated this well. Will continue expectant management. #Pain: Epidural in place; pain well-controlled.  #FWB: Cat 1 tracing #GBS: Negative  #A1DM: POCT glucose trend within normal limits. EFW 86%ile at [redacted]w[redacted]d. Continue POCT glucose checks every 4 hours. #Anticipated MOD: SVD  [redacted]w[redacted]d, MD Obstetrics Fellow 11:37 AM

## 2020-10-08 DIAGNOSIS — O24410 Gestational diabetes mellitus in pregnancy, diet controlled: Secondary | ICD-10-CM | POA: Diagnosis not present

## 2020-10-08 LAB — GLUCOSE, CAPILLARY: Glucose-Capillary: 86 mg/dL (ref 70–99)

## 2020-10-08 MED ORDER — OXYCODONE-ACETAMINOPHEN 5-325 MG PO TABS: 1.0000 | ORAL_TABLET | ORAL | Status: DC | PRN

## 2020-10-08 MED ORDER — IBUPROFEN 600 MG PO TABS: 600.0000 mg | ORAL_TABLET | Freq: Four times a day (QID) | ORAL | 0 refills | Status: DC

## 2020-10-08 NOTE — Anesthesia Postprocedure Evaluation (Signed)
Anesthesia Post Note  Patient: Lauren Aguilar  Procedure(s) Performed: AN AD HOC LABOR EPIDURAL     Patient location during evaluation: Mother Baby Anesthesia Type: Epidural Level of consciousness: awake Pain management: satisfactory to patient Vital Signs Assessment: post-procedure vital signs reviewed and stable Respiratory status: spontaneous breathing Cardiovascular status: stable Anesthetic complications: no   No notable events documented.  Last Vitals:  Vitals:   10/08/20 1426 10/08/20 1427  BP: 104/68   Pulse: 73   Resp: 17   Temp: (!) 36.4 C 36.8 C  SpO2: 98%     Last Pain:  Vitals:   10/08/20 1427  TempSrc: Oral  PainSc:    Pain Goal: Patients Stated Pain Goal: 0 (10/06/20 2109)                 Cephus Shelling

## 2020-10-08 NOTE — Progress Notes (Signed)
RN called Cleone Slim for verbal order for PRN Percocet 5-325mg  order. Cleone Slim placed order. RN assessed patients pain before giving percocet and patient stated that she did not want the oxy anymore because the heat had helped. Patients original abdominal pain score was an 8 and her back pain score was a 9. A k-pad was applied to the patients back and heat packs were applied to her lower abdomen. Patients abdomen score decreased to a 5 and back pain decreased to a 7. Patient due for ibuprofen at 0600. Patient will receive dose at 0600.

## 2020-10-08 NOTE — Lactation Note (Signed)
This note was copied from a baby's chart. Lactation Consultation Note  Patient Name: Lauren Aguilar ZYSAY'T Date: 10/08/2020 Reason for consult: Initial assessment;Term;Primapara Age:30 hours  LC in to visit with P1 Mom of term baby.  Mom is choosing to exclusively pump and breast milk feed by bottle.    Mom was set up with a DEBP by her RN this morning.   Reviewed breast massage and hand expression and encouraged this along with pumping both breasts whenever baby is being fed.   Sized flanges to 27 mm flanges for a better fit. Reviewed importance of disassembling pump parts, washing, rinsing and air drying in separate bin provided.  Mom has a Medline double pump from Medicaid.  Mom informed of pump rental in gift shop in hospital.  Explained importance of a hospital grade pump in first 4-6 weeks of exclusive pumping.  Lactation brochure provided.  Mom is aware of IP and OP lactation support available to her.  Maternal Data Has patient been taught Hand Expression?: Yes Does the patient have breastfeeding experience prior to this delivery?: No  Feeding Mother's Current Feeding Choice: Breast Milk and Formula Nipple Type: Extra Slow Flow  Lactation Tools Discussed/Used Tools: Pump Breast pump type: Double-Electric Breast Pump Pump Education: Setup, frequency, and cleaning;Milk Storage Reason for Pumping: Support milk supply/Mom's desire to exclusively pump and bottle feed Pumping frequency: once Pumped volume: 0 mL (drops)  Interventions Interventions: Breast feeding basics reviewed;Skin to skin;Breast massage;Hand express;Hand pump;DEBP;Education  Discharge Pump: Personal (Medline DEBP from Medicaid) WIC Program: No  Consult Status Consult Status: Follow-up Date: 10/09/20 Follow-up type: In-patient    Lauren Aguilar 10/08/2020, 5:01 PM

## 2020-10-08 NOTE — Progress Notes (Signed)
Patient rated pain lower abdomen an 8 on pain scale 0-10 and lower back pain a 9 during first four hour check. RN checked MAR and patient does not have order for any stronger medication. Patient had a dose of percocet 5-325mg  at 2049 in Labor and Delivery for back pain. RN called first call L&D at 0300 and physician was in a delivery. Another RN answered the call and said to call back or wait for a call back from physician. RN gave patient a k-pad for her back and two heat packs for her lower abdomen. RN called physician back at [phone] and physician was still in a delivery. RN stated that she would call back again to talk to physician for medication order. RN will continue to monitor patients pain level.

## 2020-10-10 ENCOUNTER — Telehealth: Payer: Medicaid Other | Admitting: Obstetrics and Gynecology

## 2020-10-15 DIAGNOSIS — Z419 Encounter for procedure for purposes other than remedying health state, unspecified: Secondary | ICD-10-CM | POA: Diagnosis not present

## 2020-10-18 ENCOUNTER — Telehealth (HOSPITAL_COMMUNITY): Payer: Self-pay | Admitting: *Deleted

## 2020-10-18 DIAGNOSIS — Z7689 Persons encountering health services in other specified circumstances: Secondary | ICD-10-CM | POA: Diagnosis not present

## 2020-10-18 NOTE — Telephone Encounter (Signed)
Hospital Discharge Follow-Up Call:  Patient reports she is well and has no concerns about her healing process.  Her EPDS score today was 1 and patient says this accurately reflects that she is doing well emotionally.  Patient says that baby is well and she has no concern's about baby's health at this time.

## 2020-10-31 ENCOUNTER — Telehealth: Payer: Self-pay

## 2020-10-31 NOTE — Telephone Encounter (Signed)
Patient left VM on nurse line requesting a call back to discuss returning to work earlier than planned. Per chart review, pt sent a message about same issue and was contacted by Billie Ruddy, RN. Patient has not read message. Called pt and asked her to respond directly to [facility] for more information.

## 2020-11-08 ENCOUNTER — Other Ambulatory Visit: Payer: Self-pay

## 2020-11-08 ENCOUNTER — Ambulatory Visit (INDEPENDENT_AMBULATORY_CARE_PROVIDER_SITE_OTHER): Payer: Medicaid Other | Admitting: Family Medicine

## 2020-11-08 ENCOUNTER — Encounter: Payer: Self-pay | Admitting: Family Medicine

## 2020-11-08 VITALS — BP 115/79 | HR 84 | Ht 64.0 in | Wt 185.2 lb

## 2020-11-08 DIAGNOSIS — O24419 Gestational diabetes mellitus in pregnancy, unspecified control: Secondary | ICD-10-CM

## 2020-11-08 DIAGNOSIS — R87619 Unspecified abnormal cytological findings in specimens from cervix uteri: Secondary | ICD-10-CM | POA: Diagnosis not present

## 2020-11-08 MED ORDER — NORGESTIM-ETH ESTRAD TRIPHASIC 0.18/0.215/0.25 MG-25 MCG PO TABS: 1.0000 | ORAL_TABLET | Freq: Every day | ORAL | 6 refills | Status: DC

## 2020-11-08 NOTE — Progress Notes (Signed)
Post Partum Visit Note  Lauren Aguilar is a 30 y.o. [id number] female who presents for a postpartum visit. She is 4 weeks postpartum following a normal spontaneous vaginal delivery.  I have fully reviewed the prenatal and intrapartum course. The delivery was at 40/0 gestational weeks.  Anesthesia: epidural. Postpartum course has been overall uncomplicated and patient would like to go back to work. Baby is doing well. Baby is feeding by bottle - [employer] Good Start. Bleeding no bleeding. Bowel function is normal. Bladder function is normal. Patient is not sexually active. Contraception method is none. Postpartum depression screening: negative.   The pregnancy intention screening data noted above was reviewed. Potential methods of contraception were discussed. The patient elected to proceed with OCPs   Edinburgh Postnatal Depression Scale - 11/08/20 1618       Edinburgh Postnatal Depression Scale:  In the Past 7 Days   I have been able to laugh and see the funny side of things. 0    I have looked forward with enjoyment to things. 0    I have blamed myself unnecessarily when things went wrong. 0    I have been anxious or worried for no good reason. 0    I have felt scared or panicky for no good reason. 0    Things have been getting on top of me. 0    I have been so unhappy that I have had difficulty sleeping. 0    I have felt sad or miserable. 0    I have been so unhappy that I have been crying. 0    The thought of harming myself has occurred to me. 0    Edinburgh Postnatal Depression Scale Total 0             Health Maintenance Due  Topic Date Due   URINE MICROALBUMIN  Never done   COVID-19 Vaccine (3 - Booster for Pfizer series) 07/01/2020   INFLUENZA VACCINE  10/15/2020    The following portions of the patient's history were reviewed and updated as appropriate: allergies, current medications, past family history, past medical history, past social history, past surgical  history, and problem list.  Review of Systems Pertinent items are noted in HPI.  Objective:  BP 115/79   Pulse 84   Ht 5\' 4"  (1.626 m)   Wt 185 lb 3.2 oz (84 kg)   LMP 01/01/2020   Breastfeeding No   BMI 31.79 kg/m    General:  alert   Breasts:  not indicated  Lungs: Breathing comfortably on room air  Heart:  regular rate and rhythm, S1, S2 normal, no murmur, click, rub or gallop  Abdomen: soft, non-tender; bowel sounds normal; no masses,  no organomegaly   Wound none  GU exam:  not indicated       Assessment:    There are no diagnoses linked to this encounter.  #Post Partum Here for PP visit at 4 weeks post partum. Overall recovering well and denies any current active concerns. Would like to go back to work now (works from home) and 01/03/2020. Also discussed contraception in detail and patient opting for OCPs. We discussed starting them at 6 weeks post partum since increased risk of blood clots and prescription was sent to her pharmacy. Discussed using alternate form of contraception in the meantime and patient expressed understanding. See below for other details.  #GDM - lab visit scheduled for PP GTT, advised patient to fast prior to visit  Plan:  Essential components of care per ACOG recommendations:  1.  Mood and well being: Patient with negative depression screening today. Reviewed local resources for support.  - Patient tobacco use? No.   - hx of drug use? No.    2. Infant care and feeding:  -Patient currently breastmilk feeding? No.  -Social determinants of health (SDOH) reviewed in EPIC. No concerns  3. Sexuality, contraception and birth spacing - Patient does not want a pregnancy in the next year.   - Reviewed forms of contraception in tiered fashion. Patient desired oral contraceptives (estrogen/progesterone) and was sent to patient's pharmacy and discussed starting in 2 weeks and having alternate form of contraception in the meantime.   - Discussed  birth spacing of 18 months  4. Sleep and fatigue -Encouraged family/partner/community support of 4 hrs of uninterrupted sleep to help with mood and fatigue  5. Physical Recovery  - Discussed patients delivery and complications.  - Patient had a Vaginal with shoulder dystocia. Perineal healing reviewed. Patient expressed understanding - Patient has urinary incontinence? No. - Patient is safe to resume physical and sexual activity  6.  Health Maintenance - HM due items addressed Yes - Last pap smear  Diagnosis  Date Value Ref Range Status  03/28/2020 (A)  Final   - Atypical squamous cells of undetermined significance (ASC-US)   Pap smear not done at today's visit.  - Due for Colposcopy per ASCCP guidelines. Unfortunately this was not seen or discussed during visit. Tried to call patient after visit to discuss however no answer. Will send message to clinical pool to call and discuss with patient and for scheduling colposcopy.  -Breast Cancer screening indicated? No.   7. Chronic Disease/Pregnancy Condition follow up: 2hr PP GTT ordered   - PCP follow up  Warner Mccreedy, MD, MPH OB Fellow, [facility]

## 2020-11-09 ENCOUNTER — Telehealth: Payer: Self-pay | Admitting: *Deleted

## 2020-11-09 NOTE — Telephone Encounter (Signed)
I called Marnie to notify of results and recommendation for colposcopy per Dr. Ephriam Jenkins. I left a message I was calling to give results and notify of appointment we are scheduling, since I did not reach you I will send a MyChart message. Please contact us if you cannot get the message or have questions. Lyna Laningham,RN

## 2020-11-09 NOTE — Telephone Encounter (Signed)
-----   Message from Warner Mccreedy, MD sent at 11/08/2020  6:36 PM EDT ----- Regarding: Request to call regarding abnormal PAP and colposcopy recommendation and schedule Hi This patient had a PAP during pregnancy that was abnormal and she needs a colposcopy per the guidelines. I had a post partum visit with her but unfortunately did not get to address that with her. I tried calling her now to discuss but she did not answer.  Would you be able to call her and let her know that she is due for a Colposcopy per ASCCP guidelines since she had an abnormal PAP and would you be able to schedule her visit?  Happy to help in any way I can! Thanks Dr. Ephriam Jenkins

## 2020-11-14 ENCOUNTER — Ambulatory Visit: Payer: Medicaid Other | Admitting: Certified Nurse Midwife

## 2020-11-15 ENCOUNTER — Ambulatory Visit: Payer: Medicaid Other | Admitting: Obstetrics and Gynecology

## 2020-11-15 DIAGNOSIS — Z419 Encounter for procedure for purposes other than remedying health state, unspecified: Secondary | ICD-10-CM | POA: Diagnosis not present

## 2020-11-22 ENCOUNTER — Other Ambulatory Visit: Payer: Medicaid Other

## 2020-11-22 ENCOUNTER — Other Ambulatory Visit: Payer: Self-pay

## 2020-11-22 DIAGNOSIS — O24419 Gestational diabetes mellitus in pregnancy, unspecified control: Secondary | ICD-10-CM

## 2020-12-11 ENCOUNTER — Ambulatory Visit: Payer: Medicaid Other | Admitting: Obstetrics and Gynecology

## 2020-12-11 DIAGNOSIS — Z7689 Persons encountering health services in other specified circumstances: Secondary | ICD-10-CM | POA: Diagnosis not present

## 2020-12-15 DIAGNOSIS — Z419 Encounter for procedure for purposes other than remedying health state, unspecified: Secondary | ICD-10-CM | POA: Diagnosis not present

## 2020-12-24 ENCOUNTER — Other Ambulatory Visit: Payer: Medicaid Other

## 2021-01-11 ENCOUNTER — Encounter (HOSPITAL_COMMUNITY)
Admission: EM | Disposition: A | Discharge status: Home / Self Care | Payer: Self-pay | Source: Home / Self Care | Attending: Emergency Medicine

## 2021-01-11 ENCOUNTER — Encounter (HOSPITAL_COMMUNITY): Payer: Self-pay

## 2021-01-11 ENCOUNTER — Emergency Department (HOSPITAL_COMMUNITY): Payer: Medicaid Other | Admitting: Certified Registered Nurse Anesthetist

## 2021-01-11 ENCOUNTER — Observation Stay (HOSPITAL_COMMUNITY): Payer: Medicaid Other

## 2021-01-11 ENCOUNTER — Emergency Department (HOSPITAL_COMMUNITY): Payer: Medicaid Other

## 2021-01-11 ENCOUNTER — Observation Stay (HOSPITAL_COMMUNITY)
Admission: EM | Admit: 2021-01-11 | Discharge: 2021-01-12 | Disposition: A | Discharge status: Home / Self Care | Payer: Medicaid Other | Attending: Surgery | Admitting: Surgery

## 2021-01-11 ENCOUNTER — Emergency Department (HOSPITAL_COMMUNITY)
Admission: EM | Admit: 2021-01-11 | Discharge: 2021-01-11 | Disposition: A | Discharge status: Home / Self Care | Payer: Medicaid Other | Source: Home / Self Care | Attending: Emergency Medicine | Admitting: Emergency Medicine

## 2021-01-11 ENCOUNTER — Encounter (HOSPITAL_COMMUNITY): Payer: Self-pay | Admitting: Emergency Medicine

## 2021-01-11 DIAGNOSIS — K8020 Calculus of gallbladder without cholecystitis without obstruction: Secondary | ICD-10-CM | POA: Diagnosis not present

## 2021-01-11 DIAGNOSIS — K8012 Calculus of gallbladder with acute and chronic cholecystitis without obstruction: Principal | ICD-10-CM | POA: Insufficient documentation

## 2021-01-11 DIAGNOSIS — N9489 Other specified conditions associated with female genital organs and menstrual cycle: Secondary | ICD-10-CM | POA: Insufficient documentation

## 2021-01-11 DIAGNOSIS — Z20822 Contact with and (suspected) exposure to covid-19: Secondary | ICD-10-CM | POA: Diagnosis not present

## 2021-01-11 DIAGNOSIS — K819 Cholecystitis, unspecified: Secondary | ICD-10-CM | POA: Diagnosis present

## 2021-01-11 DIAGNOSIS — G0490 Encephalitis and encephalomyelitis, unspecified: Secondary | ICD-10-CM | POA: Diagnosis not present

## 2021-01-11 DIAGNOSIS — K2900 Acute gastritis without bleeding: Secondary | ICD-10-CM | POA: Diagnosis not present

## 2021-01-11 DIAGNOSIS — R1013 Epigastric pain: Secondary | ICD-10-CM | POA: Diagnosis present

## 2021-01-11 DIAGNOSIS — K810 Acute cholecystitis: Secondary | ICD-10-CM | POA: Diagnosis not present

## 2021-01-11 DIAGNOSIS — R1011 Right upper quadrant pain: Secondary | ICD-10-CM | POA: Diagnosis not present

## 2021-01-11 DIAGNOSIS — K8066 Calculus of gallbladder and bile duct with acute and chronic cholecystitis without obstruction: Secondary | ICD-10-CM | POA: Diagnosis not present

## 2021-01-11 DIAGNOSIS — R9431 Abnormal electrocardiogram [ECG] [EKG]: Secondary | ICD-10-CM | POA: Diagnosis not present

## 2021-01-11 HISTORY — PX: CHOLECYSTECTOMY: SHX55

## 2021-01-11 LAB — CBC
HCT: 35.9 % — ABNORMAL LOW (ref 36.0–46.0)
Hemoglobin: 11.1 g/dL — ABNORMAL LOW (ref 12.0–15.0)
MCH: 25.3 pg — ABNORMAL LOW (ref 26.0–34.0)
MCHC: 30.9 g/dL (ref 30.0–36.0)
MCV: 81.8 fL (ref 80.0–100.0)
Platelets: 339 10*3/uL (ref 150–400)
RBC: 4.39 MIL/uL (ref 3.87–5.11)
RDW: 14.1 % (ref 11.5–15.5)
WBC: 9 10*3/uL (ref 4.0–10.5)
nRBC: 0 % (ref 0.0–0.2)

## 2021-01-11 LAB — COMPREHENSIVE METABOLIC PANEL
ALT: 20 U/L (ref 0–44)
ALT: 20 U/L (ref 0–44)
AST: 19 U/L (ref 15–41)
AST: 21 U/L (ref 15–41)
Albumin: 3.6 g/dL (ref 3.5–5.0)
Albumin: 3.7 g/dL (ref 3.5–5.0)
Alkaline Phosphatase: 60 U/L (ref 38–126)
Alkaline Phosphatase: 64 U/L (ref 38–126)
Anion gap: 8 (ref 5–15)
Anion gap: 7 (ref 5–15)
BUN: 8 mg/dL (ref 6–20)
BUN: 7 mg/dL (ref 6–20)
CO2: 26 mmol/L (ref 22–32)
CO2: 26 mmol/L (ref 22–32)
Calcium: 8.8 mg/dL — ABNORMAL LOW (ref 8.9–10.3)
Calcium: 8.8 mg/dL — ABNORMAL LOW (ref 8.9–10.3)
Chloride: 104 mmol/L (ref 98–111)
Chloride: 103 mmol/L (ref 98–111)
Creatinine, Ser: 0.63 mg/dL (ref 0.44–1.00)
Creatinine, Ser: 0.65 mg/dL (ref 0.44–1.00)
GFR, Estimated: >60 mL/min (ref >60)
GFR, Estimated: >60 mL/min (ref >60)
Glucose, Bld: 105 mg/dL — ABNORMAL HIGH (ref 70–99)
Glucose, Bld: 114 mg/dL — ABNORMAL HIGH (ref 70–99)
Potassium: 3.6 mmol/L (ref 3.5–5.1)
Potassium: 3.6 mmol/L (ref 3.5–5.1)
Sodium: 138 mmol/L (ref 135–145)
Sodium: 136 mmol/L (ref 135–145)
Total Bilirubin: 0.3 mg/dL (ref 0.3–1.2)
Total Bilirubin: 0.5 mg/dL (ref 0.3–1.2)
Total Protein: 6.8 g/dL (ref 6.5–8.1)
Total Protein: 7.2 g/dL (ref 6.5–8.1)

## 2021-01-11 LAB — URINALYSIS, ROUTINE W REFLEX MICROSCOPIC
Bacteria, UA: NONE SEEN
Bilirubin Urine: NEGATIVE
Glucose, UA: NEGATIVE mg/dL
Ketones, ur: NEGATIVE mg/dL
Leukocytes,Ua: NEGATIVE
Nitrite: NEGATIVE
Protein, ur: NEGATIVE mg/dL
Specific Gravity, Urine: 1.01 (ref 1.005–1.030)
pH: 7 (ref 5.0–8.0)

## 2021-01-11 LAB — CBC WITH DIFFERENTIAL/PLATELET
Abs Immature Granulocytes: 0.03 10*3/uL (ref 0.00–0.07)
Basophils Absolute: 0.1 10*3/uL (ref 0.0–0.1)
Basophils Relative: 1 %
Eosinophils Absolute: 0.4 10*3/uL (ref 0.0–0.5)
Eosinophils Relative: 4 %
HCT: 35.3 % — ABNORMAL LOW (ref 36.0–46.0)
Hemoglobin: 10.8 g/dL — ABNORMAL LOW (ref 12.0–15.0)
Immature Granulocytes: 0 %
Lymphocytes Relative: 33 %
Lymphs Abs: 3.7 10*3/uL (ref 0.7–4.0)
MCH: 24.9 pg — ABNORMAL LOW (ref 26.0–34.0)
MCHC: 30.6 g/dL (ref 30.0–36.0)
MCV: 81.3 fL (ref 80.0–100.0)
Monocytes Absolute: 0.7 10*3/uL (ref 0.1–1.0)
Monocytes Relative: 6 %
Neutro Abs: 6.2 10*3/uL (ref 1.7–7.7)
Neutrophils Relative %: 56 %
Platelets: 337 10*3/uL (ref 150–400)
RBC: 4.34 MIL/uL (ref 3.87–5.11)
RDW: 14.2 % (ref 11.5–15.5)
WBC: 11 10*3/uL — ABNORMAL HIGH (ref 4.0–10.5)
nRBC: 0 % (ref 0.0–0.2)

## 2021-01-11 LAB — I-STAT BETA HCG BLOOD, ED (MC, WL, AP ONLY): I-stat hCG, quantitative: <5 m[IU]/mL (ref <5)

## 2021-01-11 LAB — LIPASE, BLOOD
Lipase: 28 U/L (ref 11–51)
Lipase: 23 U/L (ref 11–51)

## 2021-01-11 LAB — RESP PANEL BY RT-PCR (FLU A&B, COVID) ARPGX2
Influenza A by PCR: NEGATIVE
Influenza B by PCR: NEGATIVE
SARS Coronavirus 2 by RT PCR: NEGATIVE

## 2021-01-11 SURGERY — LAPAROSCOPIC CHOLECYSTECTOMY
Anesthesia: General | Site: Abdomen

## 2021-01-11 MED ORDER — DEXAMETHASONE SODIUM PHOSPHATE 10 MG/ML IJ SOLN
INTRAMUSCULAR | Status: DC | PRN
Start: 2021-01-11 — End: 2021-01-11
  Administered 2021-01-11: 10 mg via INTRAVENOUS

## 2021-01-11 MED ORDER — MIDAZOLAM HCL 2 MG/2ML IJ SOLN
INTRAMUSCULAR | Status: DC | PRN
  Administered 2021-01-11: 2 mg via INTRAVENOUS

## 2021-01-11 MED ORDER — ONDANSETRON 4 MG PO TBDP: 4.0000 mg | ORAL_TABLET | Freq: Four times a day (QID) | ORAL | Status: DC | PRN

## 2021-01-11 MED ORDER — HYDROMORPHONE HCL 1 MG/ML IJ SOLN
0.5000 mg | INTRAMUSCULAR | Status: DC | PRN
Start: 2021-01-11 — End: 2021-01-12

## 2021-01-11 MED ORDER — LACTATED RINGERS IV SOLN: INTRAVENOUS | Status: DC

## 2021-01-11 MED ORDER — LIDOCAINE 2% (20 MG/ML) 5 ML SYRINGE
INTRAMUSCULAR | Status: DC | PRN
  Administered 2021-01-11: 100 mg via INTRAVENOUS

## 2021-01-11 MED ORDER — TRAMADOL HCL 50 MG PO TABS
50.0000 mg | ORAL_TABLET | Freq: Four times a day (QID) | ORAL | Status: DC | PRN
Start: 2021-01-11 — End: 2021-01-12

## 2021-01-11 MED ORDER — DIPHENHYDRAMINE HCL 50 MG/ML IJ SOLN: 25.0000 mg | Freq: Four times a day (QID) | INTRAMUSCULAR | Status: DC | PRN

## 2021-01-11 MED ORDER — SODIUM CHLORIDE 0.9 % IR SOLN
Status: DC | PRN
  Administered 2021-01-11: 900 mL

## 2021-01-11 MED ORDER — METHOCARBAMOL 500 MG PO TABS
500.0000 mg | ORAL_TABLET | Freq: Four times a day (QID) | ORAL | Status: DC | PRN
  Administered 2021-01-12: 500 mg via ORAL
  Filled 2021-01-11: qty 1

## 2021-01-11 MED ORDER — SODIUM CHLORIDE 0.9 % IV SOLN
2.0000 g | INTRAVENOUS | Status: DC
  Administered 2021-01-11: 2 g via INTRAVENOUS
  Filled 2021-01-11: qty 20

## 2021-01-11 MED ORDER — KETOROLAC TROMETHAMINE 30 MG/ML IJ SOLN
30.0000 mg | Freq: Once | INTRAMUSCULAR | Status: AC | PRN
  Administered 2021-01-11: 30 mg via INTRAVENOUS

## 2021-01-11 MED ORDER — MORPHINE SULFATE (PF) 4 MG/ML IV SOLN
4.0000 mg | Freq: Once | INTRAVENOUS | Status: AC
  Administered 2021-01-11: 4 mg via INTRAVENOUS
  Filled 2021-01-11: qty 1

## 2021-01-11 MED ORDER — PANTOPRAZOLE SODIUM 20 MG PO TBEC
20.0000 mg | DELAYED_RELEASE_TABLET | Freq: Every day | ORAL | Status: DC
  Administered 2021-01-12: 20 mg via ORAL
  Filled 2021-01-11 (×2): qty 1

## 2021-01-11 MED ORDER — ALUM & MAG HYDROXIDE-SIMETH 200-200-20 MG/5ML PO SUSP
30.0000 mL | Freq: Once | ORAL | Status: AC
  Administered 2021-01-11: 30 mL via ORAL
  Filled 2021-01-11: qty 30

## 2021-01-11 MED ORDER — CHLORHEXIDINE GLUCONATE 0.12 % MT SOLN: 15.0000 mL | Freq: Once | OROMUCOSAL | Status: DC

## 2021-01-11 MED ORDER — PANTOPRAZOLE SODIUM 20 MG PO TBEC: 20.0000 mg | DELAYED_RELEASE_TABLET | Freq: Every day | ORAL | 0 refills | Status: AC

## 2021-01-11 MED ORDER — BUPIVACAINE-EPINEPHRINE (PF) 0.25% -1:200000 IJ SOLN
INTRAMUSCULAR | Status: DC | PRN
  Administered 2021-01-11: 26 mL

## 2021-01-11 MED ORDER — SUGAMMADEX SODIUM 200 MG/2ML IV SOLN
INTRAVENOUS | Status: DC | PRN
  Administered 2021-01-11: 200 mg via INTRAVENOUS

## 2021-01-11 MED ORDER — ORAL CARE MOUTH RINSE: 15.0000 mL | Freq: Once | OROMUCOSAL | Status: DC

## 2021-01-11 MED ORDER — LIDOCAINE VISCOUS HCL 2 % MT SOLN
15.0000 mL | Freq: Once | OROMUCOSAL | Status: AC
  Administered 2021-01-11: 15 mL via ORAL
  Filled 2021-01-11: qty 15

## 2021-01-11 MED ORDER — MORPHINE SULFATE (PF) 2 MG/ML IV SOLN: 2.0000 mg | INTRAVENOUS | Status: DC | PRN

## 2021-01-11 MED ORDER — CHLORHEXIDINE GLUCONATE 0.12 % MT SOLN
OROMUCOSAL | Status: AC
  Filled 2021-01-11: qty 15

## 2021-01-11 MED ORDER — KETOROLAC TROMETHAMINE 30 MG/ML IJ SOLN
INTRAMUSCULAR | Status: AC
  Filled 2021-01-11: qty 1

## 2021-01-11 MED ORDER — AMISULPRIDE (ANTIEMETIC) 5 MG/2ML IV SOLN: 10.0000 mg | Freq: Once | INTRAVENOUS | Status: DC | PRN

## 2021-01-11 MED ORDER — FENTANYL CITRATE (PF) 250 MCG/5ML IJ SOLN
INTRAMUSCULAR | Status: AC
  Filled 2021-01-11: qty 5

## 2021-01-11 MED ORDER — GABAPENTIN 300 MG PO CAPS
300.0000 mg | ORAL_CAPSULE | Freq: Two times a day (BID) | ORAL | Status: DC
  Administered 2021-01-11 – 2021-01-12 (×2): 300 mg via ORAL
  Filled 2021-01-11 (×2): qty 1

## 2021-01-11 MED ORDER — ONDANSETRON HCL 4 MG/2ML IJ SOLN: 4.0000 mg | Freq: Four times a day (QID) | INTRAMUSCULAR | Status: DC | PRN

## 2021-01-11 MED ORDER — DICYCLOMINE HCL 10 MG/5ML PO SOLN
10.0000 mg | Freq: Once | ORAL | Status: AC
  Administered 2021-01-11: 10 mg via ORAL
  Filled 2021-01-11: qty 5

## 2021-01-11 MED ORDER — MIDAZOLAM HCL 2 MG/2ML IJ SOLN
INTRAMUSCULAR | Status: AC
  Filled 2021-01-11: qty 2

## 2021-01-11 MED ORDER — ONDANSETRON HCL 4 MG/2ML IJ SOLN
INTRAMUSCULAR | Status: DC | PRN
  Administered 2021-01-11: 4 mg via INTRAVENOUS

## 2021-01-11 MED ORDER — OXYCODONE HCL 5 MG PO TABS
5.0000 mg | ORAL_TABLET | Freq: Once | ORAL | Status: AC | PRN
  Administered 2021-01-11: 5 mg via ORAL

## 2021-01-11 MED ORDER — ACETAMINOPHEN 500 MG PO TABS: 1000.0000 mg | ORAL_TABLET | Freq: Four times a day (QID) | ORAL | Status: DC

## 2021-01-11 MED ORDER — PROMETHAZINE HCL 25 MG/ML IJ SOLN: 6.2500 mg | INTRAMUSCULAR | Status: DC | PRN

## 2021-01-11 MED ORDER — DIPHENHYDRAMINE HCL 25 MG PO CAPS: 25.0000 mg | ORAL_CAPSULE | Freq: Four times a day (QID) | ORAL | Status: DC | PRN

## 2021-01-11 MED ORDER — MEPERIDINE HCL 25 MG/ML IJ SOLN: 6.2500 mg | INTRAMUSCULAR | Status: DC | PRN

## 2021-01-11 MED ORDER — PANTOPRAZOLE SODIUM 40 MG IV SOLR
40.0000 mg | Freq: Once | INTRAVENOUS | Status: DC
  Filled 2021-01-11: qty 40

## 2021-01-11 MED ORDER — ACETAMINOPHEN 500 MG PO TABS
1000.0000 mg | ORAL_TABLET | Freq: Four times a day (QID) | ORAL | Status: DC
  Administered 2021-01-11 – 2021-01-12 (×3): 1000 mg via ORAL
  Filled 2021-01-11 (×3): qty 2

## 2021-01-11 MED ORDER — PROPOFOL 10 MG/ML IV BOLUS
INTRAVENOUS | Status: DC | PRN
  Administered 2021-01-11: 190 mg via INTRAVENOUS

## 2021-01-11 MED ORDER — OXYCODONE HCL 5 MG/5ML PO SOLN: 5.0000 mg | Freq: Once | ORAL | Status: AC | PRN

## 2021-01-11 MED ORDER — SODIUM CHLORIDE 0.9 % IV SOLN: INTRAVENOUS | Status: DC

## 2021-01-11 MED ORDER — HYDROMORPHONE HCL 1 MG/ML IJ SOLN
INTRAMUSCULAR | Status: AC
  Filled 2021-01-11: qty 1

## 2021-01-11 MED ORDER — SIMETHICONE 80 MG PO CHEW: 40.0000 mg | CHEWABLE_TABLET | Freq: Four times a day (QID) | ORAL | Status: DC | PRN

## 2021-01-11 MED ORDER — KETOROLAC TROMETHAMINE 15 MG/ML IJ SOLN
15.0000 mg | Freq: Four times a day (QID) | INTRAMUSCULAR | Status: DC | PRN
  Administered 2021-01-12: 15 mg via INTRAVENOUS
  Filled 2021-01-11: qty 1

## 2021-01-11 MED ORDER — HYDROMORPHONE HCL 1 MG/ML IJ SOLN
0.2500 mg | INTRAMUSCULAR | Status: DC | PRN
  Administered 2021-01-11: 0.5 mg via INTRAVENOUS

## 2021-01-11 MED ORDER — OXYCODONE HCL 5 MG PO TABS
ORAL_TABLET | ORAL | Status: AC
  Filled 2021-01-11: qty 1

## 2021-01-11 MED ORDER — SUCRALFATE 1 G PO TABS: 1.0000 g | ORAL_TABLET | Freq: Three times a day (TID) | ORAL | 0 refills | Status: AC

## 2021-01-11 MED ORDER — FENTANYL CITRATE (PF) 250 MCG/5ML IJ SOLN
INTRAMUSCULAR | Status: DC | PRN
  Administered 2021-01-11 (×3): 50 ug via INTRAVENOUS

## 2021-01-11 MED ORDER — ROCURONIUM BROMIDE 10 MG/ML (PF) SYRINGE
PREFILLED_SYRINGE | INTRAVENOUS | Status: DC | PRN
  Administered 2021-01-11: 80 mg via INTRAVENOUS

## 2021-01-11 MED ORDER — PROPOFOL 10 MG/ML IV BOLUS
INTRAVENOUS | Status: AC
  Filled 2021-01-11: qty 20

## 2021-01-11 MED ORDER — BUPIVACAINE-EPINEPHRINE (PF) 0.25% -1:200000 IJ SOLN
INTRAMUSCULAR | Status: AC
  Filled 2021-01-11: qty 30

## 2021-01-11 MED ORDER — DOCUSATE SODIUM 100 MG PO CAPS
100.0000 mg | ORAL_CAPSULE | Freq: Two times a day (BID) | ORAL | Status: DC
  Administered 2021-01-11 – 2021-01-12 (×2): 100 mg via ORAL
  Filled 2021-01-11 (×2): qty 1

## 2021-01-11 MED ORDER — PHENYLEPHRINE 40 MCG/ML (10ML) SYRINGE FOR IV PUSH (FOR BLOOD PRESSURE SUPPORT)
PREFILLED_SYRINGE | INTRAVENOUS | Status: DC | PRN
  Administered 2021-01-11: 120 ug via INTRAVENOUS
  Administered 2021-01-11 (×2): 80 ug via INTRAVENOUS

## 2021-01-11 MED ORDER — BISACODYL 10 MG RE SUPP: 10.0000 mg | Freq: Every day | RECTAL | Status: DC | PRN

## 2021-01-11 SURGICAL SUPPLY — 46 items
ADH SKN CLS APL DERMABOND .7 (GAUZE/BANDAGES/DRESSINGS) ×2
APL PRP STRL LF DISP 70% ISPRP (MISCELLANEOUS) ×2
APPLIER CLIP 5 13 M/L LIGAMAX5 (MISCELLANEOUS) ×4
APR CLP MED LRG 5 ANG JAW (MISCELLANEOUS) ×2
BAG COUNTER SPONGE SURGICOUNT (BAG) ×3 IMPLANT
BAG SPEC RTRVL LRG 6X4 10 (ENDOMECHANICALS) ×2
BAG SPNG CNTER NS LX DISP (BAG) ×2
BAG SURGICOUNT SPONGE COUNTING (BAG) ×1
CANISTER SUCT 3000ML PPV (MISCELLANEOUS) ×4 IMPLANT
CHLORAPREP W/TINT 26 (MISCELLANEOUS) ×4 IMPLANT
CLIP APPLIE 5 13 M/L LIGAMAX5 (MISCELLANEOUS) ×2 IMPLANT
CNTNR URN SCR LID CUP LEK RST (MISCELLANEOUS) ×2 IMPLANT
CONT SPEC 4OZ STRL OR WHT (MISCELLANEOUS) ×4
COVER MAYO STAND STRL (DRAPES) IMPLANT
COVER SURGICAL LIGHT HANDLE (MISCELLANEOUS) ×4 IMPLANT
DERMABOND ADVANCED (GAUZE/BANDAGES/DRESSINGS) ×2
DERMABOND ADVANCED .7 DNX12 (GAUZE/BANDAGES/DRESSINGS) ×2 IMPLANT
DRAPE C-ARM 42X120 X-RAY (DRAPES) IMPLANT
ELECT REM PT RETURN 9FT ADLT (ELECTROSURGICAL) ×4
ELECTRODE REM PT RTRN 9FT ADLT (ELECTROSURGICAL) ×2 IMPLANT
GAUZE 4X4 16PLY ~~LOC~~+RFID DBL (SPONGE) ×3 IMPLANT
GLOVE SURG ENC MOIS LTX SZ6 (GLOVE) ×4 IMPLANT
GLOVE SURG UNDER LTX SZ6.5 (GLOVE) ×4 IMPLANT
GOWN STRL REUS W/ TWL LRG LVL3 (GOWN DISPOSABLE) ×6 IMPLANT
GOWN STRL REUS W/TWL LRG LVL3 (GOWN DISPOSABLE) ×12
GRASPER SUT TROCAR 14GX15 (MISCELLANEOUS) ×4 IMPLANT
KIT BASIN OR (CUSTOM PROCEDURE TRAY) ×4 IMPLANT
KIT TURNOVER KIT B (KITS) ×4 IMPLANT
NDL INSUFFLATION 14GA 120MM (NEEDLE) ×1 IMPLANT
NEEDLE INSUFFLATION 14GA 120MM (NEEDLE) ×4 IMPLANT
NS IRRIG 1000ML POUR BTL (IV SOLUTION) ×4 IMPLANT
PAD ARMBOARD 7.5X6 YLW CONV (MISCELLANEOUS) ×4 IMPLANT
POUCH SPECIMEN RETRIEVAL 10MM (ENDOMECHANICALS) ×4 IMPLANT
SCISSORS LAP 5X35 DISP (ENDOMECHANICALS) ×4 IMPLANT
SET CHOLANGIOGRAPH 5 50 .035 (SET/KITS/TRAYS/PACK) IMPLANT
SET IRRIG TUBING LAPAROSCOPIC (IRRIGATION / IRRIGATOR) ×4 IMPLANT
SET TUBE SMOKE EVAC HIGH FLOW (TUBING) ×4 IMPLANT
SLEEVE ENDOPATH XCEL 5M (ENDOMECHANICALS) ×8 IMPLANT
SPONGE T-LAP 18X18 ~~LOC~~+RFID (SPONGE) ×3 IMPLANT
SUT MNCRL AB 4-0 PS2 18 (SUTURE) ×4 IMPLANT
TOWEL GREEN STERILE (TOWEL DISPOSABLE) ×4 IMPLANT
TOWEL GREEN STERILE FF (TOWEL DISPOSABLE) ×4 IMPLANT
TRAY LAPAROSCOPIC MC (CUSTOM PROCEDURE TRAY) ×4 IMPLANT
TROCAR XCEL NON-BLD 11X100MML (ENDOMECHANICALS) ×4 IMPLANT
TROCAR XCEL NON-BLD 5MMX100MML (ENDOMECHANICALS) ×4 IMPLANT
WATER STERILE IRR 1000ML POUR (IV SOLUTION) ×4 IMPLANT

## 2021-01-11 NOTE — ED Notes (Signed)
Jewelry given to mother , all patient clothing and cell phone with her.

## 2021-01-11 NOTE — H&P (Signed)
[facility] Admission Note  Lauren Aguilar 08/19/1990  [id number].    Requesting MD: Criss Alvine, MD Chief Complaint/Reason for Consult: epigastric pain, possible cholecystitis   HPI:  Lauren Aguilar is a 30 y/o F, 3 mos post-partum, who presents with recurrent, worsening epigastric pain. States the pain started 3 days ago after eating chili. Initially the pain was intermittent. Severe pain recurred last night after eating pizza. Pain radiates to her upper back. Not relieved by pain meds. Attempted to take advil, pepto bismol, and protonix without relief of symptoms. Denies similar pain prior. LMP ended a couple of days ago. The patient states she is not pregnant. Patient denies daily medication use, including blood thinners, states she works from home.  Denies any history of abdominal surgery. Her mother is with her today.  ROS: Review of Systems  Constitutional:  Negative for chills and fever.  HENT: Negative.    Eyes: Negative.   Respiratory: Negative.    Cardiovascular: Negative.   Gastrointestinal:  Positive for abdominal pain. Negative for blood in stool, constipation, diarrhea, melena, nausea and vomiting.  Genitourinary: Negative.   Musculoskeletal: Negative.   Skin: Negative.   Neurological: Negative.   Endo/Heme/Allergies: Negative.   Psychiatric/Behavioral: Negative.    All other systems reviewed and are negative.  Family History  Problem Relation Age of Onset   Diabetes Father    Hypertension Father    Breast cancer Paternal Grandmother    Intellectual disability Paternal Grandmother    Diabetes Paternal Grandmother    Diabetes Mother    Hypertension Mother    Colon cancer Paternal Grandfather     Past Medical History:  Diagnosis Date   Anemia    Gestational diabetes    Vaginal Pap smear, abnormal     Past Surgical History:  Procedure Laterality Date   COLPOSCOPY  06/11/2020       WISDOM TOOTH EXTRACTION      Social History:  reports that she  has never smoked. She has never used smokeless tobacco. She reports that she does not currently use alcohol. She reports that she does not use drugs.  Allergies:  Allergies  Allergen Reactions   Amoxicillin Itching and Rash    (Not in a hospital admission)   Blood pressure 121/79, pulse (!) 59, temperature 98.4 F (36.9 C), temperature source Oral, resp. rate 16, SpO2 100 %, not currently breastfeeding. Physical Exam: Constitutional: NAD; conversant; no deformities Eyes: Moist conjunctiva; no lid lag; anicteric; PERRL Neck: Trachea midline; no thyromegaly Lungs: Normal respiratory effort; no tactile fremitus CV: RRR; no palpable thrills; no pitting edema GI: Abd soft, TTP RUQ and epigastrium without guarding, murphy's sign is negative, no palpable hepatosplenomegaly MSK: Normal gait; no clubbing/cyanosis Psychiatric: Appropriate affect; alert and oriented x3 Lymphatic: No palpable cervical or axillary lymphadenopathy  Results for orders placed or performed during the hospital encounter of 01/11/21 (from the past 48 hour(s))  Lipase, blood     Status: None   Collection Time: 01/11/21  8:08 AM  Result Value Ref Range   Lipase 23 11 - 51 U/L    Comment: Performed at [facility], [address] [place], [place] [address]  Comprehensive metabolic panel     Status: Abnormal   Collection Time: 01/11/21  8:08 AM  Result Value Ref Range   Sodium 136 135 - 145 mmol/L   Potassium 3.6 3.5 - 5.1 mmol/L   Chloride 103 98 - 111 mmol/L   CO2 26 22 - 32 mmol/L  Glucose, Bld 114 (H) 70 - 99 mg/dL    Comment: Glucose reference range applies only to samples taken after fasting for at least 8 hours.   BUN 7 6 - 20 mg/dL   Creatinine, Ser 1.49 0.44 - 1.00 mg/dL   Calcium 8.8 (L) 8.9 - 10.3 mg/dL   Total Protein 7.2 6.5 - 8.1 g/dL   Albumin 3.7 3.5 - 5.0 g/dL   AST 21 15 - 41 U/L   ALT 20 0 - 44 U/L   Alkaline Phosphatase 64 38 - 126 U/L   Total Bilirubin 0.5 0.3 - 1.2 mg/dL   GFR,  Estimated >70 >26 mL/min    Comment: (NOTE) Calculated using the CKD-EPI Creatinine Equation (2021)    Anion gap 7 5 - 15    Comment: Performed at [facility], [address], [place], [place] [address]  CBC     Status: Abnormal   Collection Time: 01/11/21  8:08 AM  Result Value Ref Range   WBC 9.0 4.0 - 10.5 K/uL   RBC 4.39 3.87 - 5.11 MIL/uL   Hemoglobin 11.1 (L) 12.0 - 15.0 g/dL   HCT 85.0 (L) 27.7 - 41.2 %   MCV 81.8 80.0 - 100.0 fL   MCH 25.3 (L) 26.0 - 34.0 pg   MCHC 30.9 30.0 - 36.0 g/dL   RDW 87.8 67.6 - 72.0 %   Platelets 339 150 - 400 K/uL   nRBC 0.0 0.0 - 0.2 %    Comment: Performed at [facility], [address] [place], [place] [address]  Urinalysis, Routine w reflex microscopic Urine, Clean Catch     Status: Abnormal   Collection Time: 01/11/21 11:54 AM  Result Value Ref Range   Color, Urine STRAW (A) YELLOW   APPearance CLEAR CLEAR   Specific Gravity, Urine 1.010 1.005 - 1.030   pH 7.0 5.0 - 8.0   Glucose, UA NEGATIVE NEGATIVE mg/dL   Hgb urine dipstick SMALL (A) NEGATIVE   Bilirubin Urine NEGATIVE NEGATIVE   Ketones, ur NEGATIVE NEGATIVE mg/dL   Protein, ur NEGATIVE NEGATIVE mg/dL   Nitrite NEGATIVE NEGATIVE   Leukocytes,Ua NEGATIVE NEGATIVE   WBC, UA 0-5 0 - 5 WBC/hpf   Bacteria, UA NONE SEEN NONE SEEN    Comment: Performed at [facility] [place] [facility], [address], [place], [place] [address]   US Abdomen Limited RUQ (LIVER/GB)  Result Date: 01/11/2021 CLINICAL DATA:  Right upper quadrant pain. EXAM: ULTRASOUND ABDOMEN LIMITED RIGHT UPPER QUADRANT COMPARISON:  CT abdomen pelvis dated December 01, 2018. FINDINGS: Gallbladder: Multiple gallstones, including a stone in the gallbladder neck. Sludge. No wall thickening visualized. Positive sonographic Murphy sign noted by sonographer. Common bile duct: Diameter: 17 mm, dilated. Liver: No focal lesion identified. Within normal limits in parenchymal echogenicity. Portal vein is patent on color Doppler  imaging with normal direction of blood flow towards the liver. Other: None. IMPRESSION: 1. Cholelithiasis and gallbladder sludge with a stone in the gallbladder neck. Positive sonographic Murphy sign noted by sonographer. No wall thickening or pericholecystic fluid. Findings are equivocal for acute cholecystitis. 2. Dilated common bile duct measuring 17 mm, concerning for choledocholithiasis. Electronically Signed   By: Obie Dredge M.D.   On: 01/11/2021 11:32      Assessment/Plan Symptomatic cholelithiasis, suspect acute calculous cholecystitis  30 y/o F who is otherwise healthy, recent uncomplicated vaginal delivery of her daughter, who presents with worsening epigastric and RUQ abdominal pain that is exacerbated by PO intake and  not relieved with antacid medications. Afebrile, WBC slightly elevated this morning 11,000, repeat CBC later in the morning with WBC 9,000. RUQ U/s with gallstones (one near the neck of the gallbladder), sludge, and without pericholecystic fluid or a thickened wall. Read as positive sonographic murphy's. Patient symptoms and workup are most consistent with cholecystitis. I recommend proceeding with laparoscopic cholecystectomy today. NPO. IVF abx.   The risks of surgery including bleeding, infection, conversion to open, damage to surrounding structures, abscess, drain placement, need for additional surgeries/procedures, heart attack, CVA, and respiratory complications were discussed with the patient and she would like to proceed with surgery. All of the patients questions at the time of my evaluation were answered.    3 mos Post-partum FEN: NPO, IVF ID: Rocephin VTE: SCD's, Dispo: laparoscopic cholecystectomy today by Dr. Fredricka Bonine, possible discharge home this evening   Adam Phenix, [facility] Please see Amion for pager number during day hours 7:00am-4:30pm 01/11/2021, 12:49 PM

## 2021-01-11 NOTE — Op Note (Signed)
Operative Note  Lauren Aguilar 30 y.o. female [id number]  01/11/2021  Surgeon: Berna Bue MD FACS  Procedure performed: Laparoscopic Cholecystectomy  Procedure classification: Emergent  Preop diagnosis: Acute cholecystitis  Post-op diagnosis/intraop findings: same  Specimens: gallbladder  Retained items: none  EBL: minimal  Complications: none  Description of procedure: After obtaining informed consent the patient was brought to the operating room. Antibiotics were administered. SCD's were applied. General endotracheal anesthesia was initiated and a formal time-out was performed. The abdomen was prepped and draped in the usual sterile fashion and the abdomen was entered using an infraumbilical veress needle after instilling the site with local. Insufflation to was obtained, 34mm trocar and camera inserted, and gross inspection revealed no evidence of injury from our entry or other intraabdominal abnormalities. Two 69mm trocars were introduced in the right midclavicular and right anterior axillary lines under direct visualization and following infiltration with local. An 46mm trocar was placed in the epigastrium.  The gallbladder was tensely distended, pale and inflamed consistent with acute cholecystitis.  This was decompressed with the Ne[place]hat in order to allow to be grasped and retracted.  The gallbladder was retracted cephalad and the infundibulum was retracted laterally. A combination of hook electrocautery and blunt dissection was utilized to clear the peritoneum from the neck and cystic duct, circumferentially isolating the cystic artery and cystic duct and lifting the gallbladder from the cystic plate. The critical view of safety was achieved with the cystic artery, cystic duct, and liver bed visualized between them with no other structures.  The cystic duct was diminutive and short, and there was a large stone impacted in the neck of the gallbladder just proximal to this  which made cholangiogram not possible.  The artery was clipped with a single clip proximally and distally and divided as was the cystic duct with three clips on the proximal end. The gallbladder was dissected from the liver plate using electrocautery. Once freed the gallbladder was placed in an endocatch bag and removed intact through the epigastric trocar site. The right upper quadrant was irrigated and aspirated, the effluent was clear. Hemostasis was once again confirmed, and reinspection of the abdomen revealed no injuries. The clips were well opposed without any bile leak from the duct or the liver bed.  Her ultrasound noted a 17 mm common bile duct, but on inspection of the porta it appears as though her common bile duct is maybe a centimeter in diameter on the visible portion.  The 35mm trocar site in the epigastrium was closed with a 0 vicryl in the fascia under direct visualization using a PMI device. The abdomen was desufflated and all trocars removed. The skin incisions were closed with subcuticular 4-0 monocryl and Dermabond. The patient was awakened, extubated and transported to the recovery room in stable condition.    All counts were correct at the completion of the case.

## 2021-01-11 NOTE — Transfer of Care (Signed)
Immediate Anesthesia Transfer of Care Note  Patient: Lauren Aguilar  Procedure(s) Performed: LAPAROSCOPIC CHOLECYSTECTOMY WITH POSSIBLE INTRAOPERATIVE CHOLANGIOGRAM (Abdomen)  Patient Location: PACU  Anesthesia Type:General  Level of Consciousness: awake, alert  and oriented  Airway & Oxygen Therapy: Patient Spontanous Breathing  Post-op Assessment: Report given to RN and Post -op Vital signs reviewed and stable  Post vital signs: Reviewed and stable  Last Vitals:  Vitals Value Taken Time  BP 126/75 01/11/21 1611  Temp    Pulse 80 01/11/21 1612  Resp 20 01/11/21 1612  SpO2 96 % 01/11/21 1612  Vitals shown include unvalidated device data.  Last Pain:  Vitals:   01/11/21 1330  TempSrc:   PainSc: 0-No pain         Complications: No notable events documented.

## 2021-01-11 NOTE — ED Provider Notes (Signed)
[facility] EMERGENCY DEPARTMENT Provider Note   CSN: [id number] Arrival date & time: 01/11/21  0800     History Chief Complaint  Patient presents with   Abdominal Pain    Lauren Aguilar is a 30 y.o. female with a past medical history significant for anemia who presents to the ED due to epigastric and right upper quadrant pain x3 days.  Patient describes pain as a burning sensation.  Pain is worse after eating.  Patient states last night pain became so severe after eating pizza.  Patient was evaluated in the ED early this morning with reassuring work-up.  Her symptoms were thought to be related to gastritis given improvement in symptoms after GI cocktail.  Patient states when she was discharged her symptoms came back and worsened.  No fever or chills.  Denies urinary vaginal symptoms.  No lower abdominal pain.  Patient states she intermittently takes NSAIDs in aspirin more recently over the past 3 days due to pain.  Denies chronic NSAID use.  Admits to occasional alcohol use.  Denies melena, hematochezia, and hematemesis.  Patient was discharged with Protonix and Carafate which she notes she used with no improvement in symptoms.  History obtained from patient and past medical records. No interpreter used during encounter.       Past Medical History:  Diagnosis Date   Anemia    Gestational diabetes    Vaginal Pap smear, abnormal     Patient Active Problem List   Diagnosis Date Noted   Abnormal Pap smear of cervix 11/08/2020   Vaginal delivery 10/07/2020   Shoulder dystocia, delivered 10/07/2020   Encounter for induction of labor 10/06/2020   [redacted] weeks gestation of pregnancy 09/20/2020   Iron deficiency anemia of mother during pregnancy 07/10/2020   Gestational diabetes 07/10/2020   Alpha thalassemia silent carrier 04/26/2020   Supervision of low-risk first pregnancy 03/28/2020   Obesity in pregnancy 03/28/2020   BMI 33.0-33.9,adult 03/28/2020   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    Past Surgical History:  Procedure Laterality Date   COLPOSCOPY  06/11/2020       WISDOM TOOTH EXTRACTION       OB History     Gravida  1   Para  1   Term  1   Preterm  0   AB  0   Living  1      SAB  0   IAB  0   Ectopic  0   Multiple  0   Live Births  1           Family History  Problem Relation Age of Onset   Diabetes Father    Hypertension Father    Breast cancer Paternal Grandmother    Intellectual disability Paternal Grandmother    Diabetes Paternal Grandmother    Diabetes Mother    Hypertension Mother    Colon cancer Paternal Grandfather     Social History   Tobacco Use   Smoking status: Never   Smokeless tobacco: Never  Vaping Use   Vaping Use: Never used  Substance Use Topics   Alcohol use: Not Currently    Comment: RARE once a week on fridays.   Drug use: Never    Home Medications Prior to Admission medications   Medication Sig Start Date End Date Taking? Authorizing Provider  ibuprofen (ADVIL) 600 MG tablet Take 1 tablet (600 mg total) by mouth every 6 (six) hours. 10/08/20   Donette Larry, CNM  Norgestimate-Ethinyl Estradiol Triphasic (  ORTHO TRI-CYCLEN LO) 0.18/0.215/0.25 MG-25 MCG tab Take 1 tablet by mouth daily. 11/08/20   Warner Mccreedy, MD  pantoprazole (PROTONIX) 20 MG tablet Take 1 tablet (20 mg total) by mouth daily for 14 days. 01/11/21 01/25/21  McDonald, Mia A, PA-C  prenatal vitamin w/FE, FA (PRENATAL 1 + 1) 27-1 MG TABS tablet Take 1 tablet by mouth daily at 12 noon. 04/26/20   Hermina Staggers, MD  sucralfate (CARAFATE) 1 g tablet Take 1 tablet (1 g total) by mouth 4 (four) times daily -  with meals and at bedtime for 14 days. 01/11/21 01/25/21  McDonald, Mia A, PA-C    Allergies    Amoxicillin  Review of Systems   Review of Systems  Constitutional:  Negative for chills and fever.  Respiratory:  Negative for shortness of breath.   Cardiovascular:  Negative for chest pain.  Gastrointestinal:   Positive for abdominal pain. Negative for diarrhea, nausea and vomiting.  Genitourinary:  Negative for dysuria and vaginal discharge.  All other systems reviewed and are negative.  Physical Exam Updated Vital Signs BP 123/85 (BP Location: Right Arm)   Pulse 77   Temp 98.4 F (36.9 C) (Oral)   Resp 16   SpO2 97%   Physical Exam Vitals and nursing note reviewed.  Constitutional:      General: She is not in acute distress.    Appearance: She is not ill-appearing.  HENT:     Head: Normocephalic.  Eyes:     Pupils: Pupils are equal, round, and reactive to light.  Cardiovascular:     Rate and Rhythm: Normal rate and regular rhythm.     Pulses: Normal pulses.     Heart sounds: Normal heart sounds. No murmur heard.   No friction rub. No gallop.  Pulmonary:     Effort: Pulmonary effort is normal.     Breath sounds: Normal breath sounds.  Abdominal:     General: Abdomen is flat. There is no distension.     Palpations: Abdomen is soft.     Tenderness: There is abdominal tenderness. There is no guarding or rebound.     Comments: Mild tenderness in epigastrium and RUQ  Musculoskeletal:        General: Normal range of motion.     Cervical back: Neck supple.  Skin:    General: Skin is warm and dry.  Neurological:     General: No focal deficit present.     Mental Status: She is alert.  Psychiatric:        Mood and Affect: Mood normal.        Behavior: Behavior normal.    ED Results / Procedures / Treatments   Labs (all labs ordered are listed, but only abnormal results are displayed) Labs Reviewed  COMPREHENSIVE METABOLIC PANEL - Abnormal; Notable for the following components:      Result Value   Glucose, Bld 114 (*)    Calcium 8.8 (*)    All other components within normal limits  CBC - Abnormal; Notable for the following components:   Hemoglobin 11.1 (*)    HCT 35.9 (*)    MCH 25.3 (*)    All other components within normal limits  LIPASE, BLOOD  URINALYSIS, ROUTINE W  REFLEX MICROSCOPIC    EKG None  Radiology No results found.  Procedures Procedures   Medications Ordered in ED Medications  pantoprazole (PROTONIX) injection 40 mg (has no administration in time range)    ED Course  I have  reviewed the triage vital signs and the nursing notes.  Pertinent labs & imaging results that were available during my care of the patient were reviewed by me and considered in my medical decision making (see chart for details).    MDM Rules/Calculators/A&P                          30 year old female presents to the ED due to epigastric and right upper quadrant pain x3 days.  Patient evaluated in the ED early this morning with reassuring work-up.  Symptoms thought to be related to gastritis due to improvement after GI cocktail.  Upon arrival, vitals all within normal limits.  Patient in no acute distress.  Physical exam significant for very mild tenderness in epigastric and right upper quadrant.  Repeat labs ordered at triage.  Pregnancy test negative early this morning.  will add right upper quadrant ultrasound to rule out gallbladder etiology.  GI cocktail given. If right upper quadrant ultrasound is negative suspect symptoms related to gastritis.  Low suspicion for cardiac etiology.  Patient denies chest pain and shortness of breath. No tachycardia or hypoxia. Low suspicion for PE/DVT. No lower abdominal tenderness to suggest appendicitis or diverticulitis. Low suspicion for pelvic etiology given location of pain. Discussed with Dr. Criss Alvine who agrees with assessment and plan.   CBC reassuring.  No leukocytosis.  Mild anemia with hemoglobin 11.1 which is consistent with her iron deficiency anemia.  UA negative for signs of infection.  CMP significant for hyperglycemia 114.  Normal renal function.  No major electrolyte derangements.  Lipase normal.  Right upper quadrant ultrasound personally reviewed which demonstrates possible acute cholecystitis.  Discussed  ultrasound results with Lauren Aguilar with general surgery who will evaluate patient at bedside.  Patient will most likely be admitted to the hospital for a cholecystectomy.  COVID test ordered.  Surgery will admit for cholecystectomy.  Final Clinical Impression(s) / ED Diagnoses Final diagnoses:  RUQ pain    Rx / DC Orders ED Discharge Orders     None        Jesusita Oka 01/11/21 1316    Pricilla Loveless, MD 01/11/21 1534

## 2021-01-11 NOTE — ED Triage Notes (Signed)
Patient here for reevaluation of abdominal, discharged this morning at approximately 0300 for same. States she was prescribed two medications, she received them from pharmacy and has taken them, but reports that pain is worse now. Patient alert, oriented, and in no apparent distress at this time. Denies any new symptoms other than increased pain.

## 2021-01-11 NOTE — Anesthesia Procedure Notes (Signed)
Procedure Name: Intubation Date/Time: 01/11/2021 3:00 PM Performed by: Dorthea Cove, CRNA Pre-anesthesia Checklist: Patient identified, Emergency Drugs available, Suction available and Patient being monitored Patient Re-evaluated:Patient Re-evaluated prior to induction Oxygen Delivery Method: Circle system utilized Preoxygenation: Pre-oxygenation with 100% oxygen Induction Type: IV induction Ventilation: Mask ventilation without difficulty Laryngoscope Size: Mac and 3 Grade View: Grade I Tube type: Oral Tube size: 7.0 mm Number of attempts: 1 Airway Equipment and Method: Stylet and Oral airway Placement Confirmation: ETT inserted through vocal cords under direct vision, positive ETCO2 and breath sounds checked- equal and bilateral Secured at: 21 cm Tube secured with: Tape Dental Injury: Teeth and Oropharynx as per pre-operative assessment

## 2021-01-11 NOTE — Anesthesia Preprocedure Evaluation (Signed)
Anesthesia Evaluation  Patient identified by MRN, date of birth, ID band Patient awake    Reviewed: Allergy & Precautions, NPO status , Patient's Chart, lab work & pertinent test results  Airway Mallampati: II  TM Distance: >3 FB Neck ROM: Full    Dental no notable dental hx. (+) Dental Advisory Given, Teeth Intact   Pulmonary neg pulmonary ROS,    Pulmonary exam normal breath sounds clear to auscultation       Cardiovascular negative cardio ROS Normal cardiovascular exam Rhythm:Regular Rate:Normal     Neuro/Psych negative neurological ROS  negative psych ROS   GI/Hepatic Neg liver ROS, Cholecystitis    Endo/Other  Obesity BMI 33  Renal/GU negative Renal ROS  negative genitourinary   Musculoskeletal negative musculoskeletal ROS (+)   Abdominal   Peds negative pediatric ROS (+)  Hematology negative hematology ROS (+) hct 35.9   Anesthesia Other Findings   Reproductive/Obstetrics negative OB ROS                             Anesthesia Physical Anesthesia Plan  ASA: 2  Anesthesia Plan: General   Post-op Pain Management:    Induction: Intravenous  PONV Risk Score and Plan: 4 or greater and Ondansetron, Dexamethasone, Midazolam and Treatment may vary due to age or medical condition  Airway Management Planned: Oral ETT  Additional Equipment: None  Intra-op Plan:   Post-operative Plan: Extubation in OR  Informed Consent: I have reviewed the patients History and Physical, chart, labs and discussed the procedure including the risks, benefits and alternatives for the proposed anesthesia with the patient or authorized representative who has indicated his/her understanding and acceptance.     Dental advisory given  Plan Discussed with: CRNA  Anesthesia Plan Comments:         Anesthesia Quick Evaluation

## 2021-01-11 NOTE — Plan of Care (Signed)
  Problem: Education: Goal: Knowledge of General Education information will improve Description: Including pain rating scale, medication(s)/side effects and non-pharmacologic comfort measures Outcome: Progressing   Problem: Activity: Goal: Risk for activity intolerance will decrease Outcome: Progressing   Problem: Pain Managment: Goal: General experience of comfort will improve Outcome: Progressing   

## 2021-01-11 NOTE — ED Triage Notes (Signed)
Pt states that she has been having epigastric pain for the past 3 days, denies n/v/d

## 2021-01-11 NOTE — Discharge Instructions (Addendum)
Thank you for allowing me to care for you today in the Emergency Department.   Your symptoms today are most consistent with gastritis, or irritation of the lining of the stomach in the lower esophagus.  Avoid NSAID medications and aspirin as these can make your symptoms worse.  NSAID medications include ibuprofen, naproxen, Aleve, or Motrin.  You should also avoid alcohol as this may make your symptoms worse as well as acidic foods.  I have also attached some information on other things that you may want to avoid to help with your symptoms.  Take 1 tablet of pantoprazole daily for the next 2 weeks.  You can also take 1 tablet of Carafate every 6 hours for the next 2 weeks.  Maalox is available over-the-counter and can be used as directed on the label to help with your symptoms.  Please get established with a primary care provider to follow-up if your symptoms do not significantly improve with this regimen in the next week.  Return to the emergency department if you are vomiting blood, if you have tarry black stools or blood in your stool, or if you develop other new, concerning symptoms.

## 2021-01-11 NOTE — ED Provider Notes (Signed)
Emergency Medicine Provider Triage Evaluation Note  Lauren Aguilar , a 30 y.o. female  was evaluated in triage.  Pt complains of epigastric pain.  She reports a 3-day history of constant epigastric pain that radiates through to her back.  The pain radiates up into the inferior portion of her chest.  Pain is characterized as burning.  It is worse with eating acidic foods.  She thought that the pain may be related to menstrual cramps that she just started her menstrual cycle and took 600 mg of ibuprofen and a dose of aspirin, but this seemed to worsen her symptoms.  She tried a dose of Pepto-Bismol and Tums tonight with no improvement.  No fever, chills, melena, hematochezia, nausea, vomiting, dysuria, hematuria, vaginal bleeding or discharge.  She is unsure if she could be pregnant.  Review of Systems  Positive: Abdominal pain Negative: Fever, chills, melena, hematochezia, nausea, vomiting, dysuria, hematuria, vaginal bleeding, discharge  Physical Exam  BP (!) 129/96 (BP Location: Right Arm)   Pulse 64   Temp 98.6 F (37 C) (Oral)   Resp 20   SpO2 100%  Gen:   Awake, no distress   Resp:  Normal effort  MSK:   Moves extremities without difficulty  Other:  Minimal tenderness to palpation in the epigastric region.  No rebound or guarding.  No CVA tenderness bilaterally.  Negative Murphy sign.  Medical Decision Making  Medically screening exam initiated at 12:11 AM.  Appropriate orders placed.  Svetlana Bagby was informed that the remainder of the evaluation will be completed by another provider, this initial triage assessment does not replace that evaluation, and the importance of remaining in the ED until their evaluation is complete.  Labs have been ordered.  We will give GI cocktail as I suspect this may be gastritis.  She will need continued evaluation and reassessment in the emergency department.   Barkley Boards, PA-C 01/11/21 0024    Sabas Sous, MD 01/11/21 0330

## 2021-01-11 NOTE — ED Provider Notes (Signed)
[facility] EMERGENCY DEPARTMENT Provider Note   CSN: [id number] Arrival date & time: 01/11/21  0004     History Chief Complaint  Patient presents with   Abdominal Pain    Lauren Aguilar is a 30 y.o. female with a history of anemia who presents emergency department with a chief complaint of epigastric abdominal pain.  The pain radiates through to her back and into the inferior portion of her chest.  She reports associated increased gas.  She characterizes the pain as burning, which has been constant for the last 3 days.  Pain is worse with eating some foods, including acidic foods.  She initially thought her pain was related to menstrual cramps and took several doses of ibuprofen and aspirin without improvement in her symptoms.  She attempted to take Tums and Pepcid tonight with no improvement.  No fever, chills, nausea, vomiting, diarrhea, dysuria, hematuria, vaginal bleeding or discharge, shortness of breath, cough melena, hematochezia, hematemesis..  No history of similar.  She is unsure if she could be pregnant.  The history is provided by the patient. No language interpreter was used.      Past Medical History:  Diagnosis Date   Anemia    Gestational diabetes    Vaginal Pap smear, abnormal     Patient Active Problem List   Diagnosis Date Noted   Abnormal Pap smear of cervix 11/08/2020   Vaginal delivery 10/07/2020   Shoulder dystocia, delivered 10/07/2020   Encounter for induction of labor 10/06/2020   [redacted] weeks gestation of pregnancy 09/20/2020   Iron deficiency anemia of mother during pregnancy 07/10/2020   Gestational diabetes 07/10/2020   Alpha thalassemia silent carrier 04/26/2020   Supervision of low-risk first pregnancy 03/28/2020   Obesity in pregnancy 03/28/2020   BMI 33.0-33.9,adult 03/28/2020   Cervical dysplasia 12/17/2015    Past Surgical History:  Procedure Laterality Date   COLPOSCOPY  06/11/2020       WISDOM TOOTH EXTRACTION        OB History     Gravida  1   Para  1   Term  1   Preterm  0   AB  0   Living  1      SAB  0   IAB  0   Ectopic  0   Multiple  0   Live Births  1           Family History  Problem Relation Age of Onset   Diabetes Father    Hypertension Father    Breast cancer Paternal Grandmother    Intellectual disability Paternal Grandmother    Diabetes Paternal Grandmother    Diabetes Mother    Hypertension Mother    Colon cancer Paternal Grandfather     Social History   Tobacco Use   Smoking status: Never   Smokeless tobacco: Never  Vaping Use   Vaping Use: Never used  Substance Use Topics   Alcohol use: Not Currently    Comment: RARE once a week on fridays.   Drug use: Never    Home Medications Prior to Admission medications   Medication Sig Start Date End Date Taking? Authorizing Provider  pantoprazole (PROTONIX) 20 MG tablet Take 1 tablet (20 mg total) by mouth daily for 14 days. 01/11/21 01/25/21 Yes Rachelanne Whidby A, PA-C  sucralfate (CARAFATE) 1 g tablet Take 1 tablet (1 g total) by mouth 4 (four) times daily -  with meals and at bedtime for 14 days. 01/11/21 01/25/21  Yes Newell Frater A, PA-C  ibuprofen (ADVIL) 600 MG tablet Take 1 tablet (600 mg total) by mouth every 6 (six) hours. 10/08/20   Donette Larry, CNM  Norgestimate-Ethinyl Estradiol Triphasic (ORTHO TRI-CYCLEN LO) 0.18/0.215/0.25 MG-25 MCG tab Take 1 tablet by mouth daily. 11/08/20   Warner Mccreedy, MD  prenatal vitamin w/FE, FA (PRENATAL 1 + 1) 27-1 MG TABS tablet Take 1 tablet by mouth daily at 12 noon. 04/26/20   Hermina Staggers, MD    Allergies    Amoxicillin  Review of Systems   Review of Systems  Constitutional:  Negative for activity change, chills, diaphoresis and fever.  HENT:  Negative for congestion and sneezing.   Respiratory:  Negative for cough, shortness of breath and wheezing.   Cardiovascular:  Negative for chest pain and palpitations.  Gastrointestinal:  Positive for  abdominal pain. Negative for blood in stool, constipation, diarrhea, nausea and vomiting.  Genitourinary:  Negative for dysuria, flank pain, pelvic pain, urgency, vaginal bleeding and vaginal pain.  Musculoskeletal:  Negative for back pain and myalgias.  Skin:  Negative for rash.  Allergic/Immunologic: Negative for immunocompromised state.  Neurological:  Negative for syncope, weakness, numbness and headaches.  Psychiatric/Behavioral:  Negative for confusion.    Physical Exam Updated Vital Signs BP 123/78 (BP Location: Right Arm)   Pulse (!) 59   Temp 98.6 F (37 C) (Oral)   Resp 18   SpO2 100%   Physical Exam Vitals and nursing note reviewed.  Constitutional:      General: She is not in acute distress.    Appearance: She is not ill-appearing, toxic-appearing or diaphoretic.  HENT:     Head: Normocephalic.     Mouth/Throat:     Mouth: Mucous membranes are moist.     Pharynx: No oropharyngeal exudate or posterior oropharyngeal erythema.  Eyes:     Conjunctiva/sclera: Conjunctivae normal.  Cardiovascular:     Rate and Rhythm: Normal rate and regular rhythm.     Heart sounds: No murmur heard.   No friction rub. No gallop.  Pulmonary:     Effort: Pulmonary effort is normal. No respiratory distress.     Breath sounds: No stridor. No wheezing, rhonchi or rales.  Chest:     Chest wall: No tenderness.  Abdominal:     General: There is no distension.     Palpations: Abdomen is soft.     Tenderness: There is abdominal tenderness.     Comments: Minimal tenderness to palpation in the epigastric region.  No rebound or guarding.  Normoactive bowel sounds.  Negative Murphy sign.  No CVA tenderness bilaterally.  No tenderness over McBurney's point.  Musculoskeletal:     Cervical back: Neck supple.  Skin:    General: Skin is warm.     Capillary Refill: Capillary refill takes less than 2 seconds.     Findings: No rash.  Neurological:     Mental Status: She is alert.  Psychiatric:         Behavior: Behavior normal.    ED Results / Procedures / Treatments   Labs (all labs ordered are listed, but only abnormal results are displayed) Labs Reviewed  CBC WITH DIFFERENTIAL/PLATELET - Abnormal; Notable for the following components:      Result Value   WBC 11.0 (*)    Hemoglobin 10.8 (*)    HCT 35.3 (*)    MCH 24.9 (*)    All other components within normal limits  COMPREHENSIVE METABOLIC PANEL - Abnormal; Notable for  the following components:   Glucose, Bld 105 (*)    Calcium 8.8 (*)    All other components within normal limits  LIPASE, BLOOD  I-STAT BETA HCG BLOOD, ED ([facility], [facility], [facility] ONLY)    EKG None  Radiology No results found.  Procedures Procedures   Medications Ordered in ED Medications  alum & mag hydroxide-simeth (MAALOX/MYLANTA) 200-200-20 MG/5ML suspension 30 mL (30 mLs Oral Given 01/11/21 0101)    And  lidocaine (XYLOCAINE) 2 % viscous mouth solution 15 mL (15 mLs Oral Given 01/11/21 0101)  dicyclomine (BENTYL) 10 MG/5ML solution 10 mg (10 mg Oral Given 01/11/21 0101)    ED Course  I have reviewed the triage vital signs and the nursing notes.  Pertinent labs & imaging results that were available during my care of the patient were reviewed by me and considered in my medical decision making (see chart for details).    MDM Rules/Calculators/A&P                           30 year old female with a history of anemia who presents the emergency department with a chief complaint of epigastric pain for the last 3 days.  No other GI complaints.  No melena or hematochezia.  Vital signs are stable.  Minimal epigastric tenderness palpation noted on exam, but no peritoneal signs.  Negative Murphy sign.  Labs and imaging have been reviewed and independently interpreted by me.  She has anemia, which is chronic.  Likely iron deficiency anemia given microcytic anemia related to her menstrual cycle.  No metabolic derangements.  Suspect gastritis, which may be  exacerbated by NSAIDs and ASA that the patient was taking.  She was given a GI cocktail with improvement of her symptoms.  Will discharge home with PPI and Carafate.  She is in agreement with the plan.  I have a low suspicion for GI bleed, ruptured peptic ulcer, cholecystitis, pancreatitis, pyelonephritis, ectopic pregnancy.  She is hemodynamically stable in no acute distress.  Recommended outpatient follow-up if symptoms do not resolve.  ER return precautions given.  Final Clinical Impression(s) / ED Diagnoses Final diagnoses:  Acute gastritis without hemorrhage, unspecified gastritis type    Rx / DC Orders ED Discharge Orders          Ordered    sucralfate (CARAFATE) 1 g tablet  3 times daily with meals & bedtime        01/11/21 0148    pantoprazole (PROTONIX) 20 MG tablet  Daily        01/11/21 0148             Leoma Folds, Pedro Earls A, PA-C 01/11/21 [id number]    Sabas Sous, MD 01/11/21 0330

## 2021-01-11 NOTE — Progress Notes (Signed)
Patient has an earring in her left ear and states that she has never taken it out.  Attempted to get out the earring with the OR nurse, Darel Hong, without success.  Dr. Salvadore Farber aware.  No orders received.

## 2021-01-11 NOTE — Discharge Instructions (Signed)
[facility] ° °Please arrive at least 30 min before your appointment to complete your check in paperwork.  If you are unable to arrive 30 min prior to your appointment time we may have to cancel or reschedule you. °LAPAROSCOPIC SURGERY: POST OP INSTRUCTIONS °Always review your discharge instruction sheet given to you by the facility where your surgery was performed. °IF YOU HAVE DISABILITY OR FAMILY LEAVE FORMS, YOU MUST BRING THEM TO THE OFFICE FOR PROCESSING.   °DO NOT GIVE THEM TO YOUR DOCTOR. ° °PAIN CONTROL ° °First take acetaminophen (Tylenol) AND/or ibuprofen (Advil) to control your pain after surgery.  Follow directions on package.  Taking acetaminophen (Tylenol) and/or ibuprofen (Advil) regularly after surgery will help to control your pain and lower the amount of prescription pain medication you may need.  You should not take more than 4,000 mg (4 grams) of acetaminophen (Tylenol) in 24 hours.  You should not take ibuprofen (Advil), aleve, motrin, naprosyn or other NSAIDS if you have a history of stomach ulcers or chronic kidney disease.  °A prescription for pain medication may be given to you upon discharge.  Take your pain medication as prescribed, if you still have uncontrolled pain after taking acetaminophen (Tylenol) or ibuprofen (Advil). °Use ice packs to help control pain. °If you need a refill on your pain medication, please contact your pharmacy.  They will contact our office to request authorization. Prescriptions will not be filled after 5pm or on week-ends. ° °HOME MEDICATIONS °Take your usually prescribed medications unless otherwise directed. ° °DIET °You should follow a light diet the first few days after arrival home.  Be sure to include lots of fluids daily. Avoid fatty, fried foods.  ° °CONSTIPATION °It is common to experience some constipation after surgery and if you are taking pain medication.  Increasing fluid intake and taking a stool softener (such as Colace)  will usually help or prevent this problem from occurring.  A mild laxative (Milk of Magnesia or Miralax) should be taken according to package instructions if there are no bowel movements after 48 hours. ° °WOUND/INCISION CARE °Most patients will experience some swelling and bruising in the area of the incisions.  Ice packs will help.  Swelling and bruising can take several days to resolve.  °Unless discharge instructions indicate otherwise, follow guidelines below  °STERI-STRIPS - you may remove your outer bandages 48 hours after surgery, and you may shower at that time.  You have steri-strips (small skin tapes) in place directly over the incision.  These strips should be left on the skin for 7-10 days.   °DERMABOND/SKIN GLUE - you may shower in 24 hours.  The glue will flake off over the next 2-3 weeks. °Any sutures or staples will be removed at the office during your follow-up visit. ° °ACTIVITIES °You may resume regular (light) daily activities beginning the next day--such as daily self-care, walking, climbing stairs--gradually increasing activities as tolerated.  You may have sexual intercourse when it is comfortable.  Refrain from any heavy lifting or straining until approved by your doctor. °You may drive when you are no longer taking prescription pain medication, you can comfortably wear a seatbelt, and you can safely maneuver your car and apply brakes. ° °FOLLOW-UP °You should see your doctor in the office for a follow-up appointment approximately 2-3 weeks after your surgery.  You should have been given your post-op/follow-up appointment when your surgery was scheduled.  If you did not receive a post-op/follow-up appointment, make sure   that you call for this appointment within a day or two after you arrive home to insure a convenient appointment time. ° ° °WHEN TO CALL YOUR DOCTOR: °Fever over 101.0 °Inability to urinate °Continued bleeding from incision. °Increased pain, redness, or drainage from the  incision. °Increasing abdominal pain ° °The clinic staff is available to answer your questions during regular business hours.  Please don’t hesitate to call and ask to speak to one of the nurses for clinical concerns.  If you have a medical emergency, go to the nearest emergency room or call 911.  A surgeon from [facility] is always on call at the hospital. °[address], [address], [place], [place]  [address] ? [address] [address], [place], [place]   [address] °[phone] ? [phone] ? FAX [phone] ° ° ° ° °Managing Your Pain After Surgery Without Opioids ° ° ° °Thank you for participating in our program to help patients manage their pain after surgery without opioids. This is part of our effort to provide you with the best care possible, without exposing you or your family to the risk that opioids pose. ° °What pain can I expect after surgery? °You can expect to have some pain after surgery. This is normal. The pain is typically worse the day after surgery, and quickly begins to get better. °Many studies have found that many patients are able to manage their pain after surgery with Over-the-Counter (OTC) medications such as Tylenol and Motrin. If you have a condition that does not allow you to take Tylenol or Motrin, notify your surgical team. ° °How will I manage my pain? °The best strategy for controlling your pain after surgery is around the clock pain control with Tylenol (acetaminophen) and Motrin (ibuprofen or Advil). Alternating these medications with each other allows you to maximize your pain control. In addition to Tylenol and Motrin, you can use heating pads or ice packs on your incisions to help reduce your pain. ° °How will I alternate your regular strength over-the-counter pain medication? °You will take a dose of pain medication every three hours. °Start by taking 650 mg of Tylenol (2 pills of 325 mg) °3 hours later take 600 mg of Motrin (3 pills of 200 mg) °3 hours after  taking the Motrin take 650 mg of Tylenol °3 hours after that take 600 mg of Motrin. ° ° °- 1 - ° °See example - if your first dose of Tylenol is at 12:00 PM ° ° °12:00 PM Tylenol 650 mg (2 pills of 325 mg)  °3:00 PM Motrin 600 mg (3 pills of 200 mg)  °6:00 PM Tylenol 650 mg (2 pills of 325 mg)  °9:00 PM Motrin 600 mg (3 pills of 200 mg)  °Continue alternating every 3 hours  ° °We recommend that you follow this schedule around-the-clock for at least 3 days after surgery, or until you feel that it is no longer needed. Use the table on the last page of this handout to keep track of the medications you are taking. °Important: °Do not take more than 3000mg of Tylenol or 3200mg of Motrin in a 24-hour period. °Do not take ibuprofen/Motrin if you have a history of bleeding stomach ulcers, severe kidney disease, &/or actively taking a blood thinner ° °What if I still have pain? °If you have pain that is not controlled with the over-the-counter pain medications (Tylenol and Motrin or Advil) you might have what we call “breakthrough” pain. You will receive a prescription   for a small amount of an opioid pain medication such as Oxycodone, Tramadol, or Tylenol with Codeine. Use these opioid pills in the first 24 hours after surgery if you have breakthrough pain. Do not take more than 1 pill every 4-6 hours. ° °If you still have uncontrolled pain after using all opioid pills, don't hesitate to call our staff using the number provided. We will help make sure you are managing your pain in the best way possible, and if necessary, we can provide a prescription for additional pain medication. ° ° °Day 1   ° °Time  °Name of Medication Number of pills taken  °Amount of Acetaminophen  °Pain Level  ° °Comments  °AM PM       °AM PM       °AM PM       °AM PM       °AM PM       °AM PM       °AM PM       °AM PM       °Total Daily amount of Acetaminophen °Do not take more than  3,000 mg per day    ° ° °Day 2   ° °Time  °Name of Medication  Number of pills °taken  °Amount of Acetaminophen  °Pain Level  ° °Comments  °AM PM       °AM PM       °AM PM       °AM PM       °AM PM       °AM PM       °AM PM       °AM PM       °Total Daily amount of Acetaminophen °Do not take more than  3,000 mg per day    ° ° °Day 3   ° °Time  °Name of Medication Number of pills taken  °Amount of Acetaminophen  °Pain Level  ° °Comments  °AM PM       °AM PM       °AM PM       °AM PM       ° ° ° °AM PM       °AM PM       °AM PM       °AM PM       °Total Daily amount of Acetaminophen °Do not take more than  3,000 mg per day    ° ° °Day 4   ° °Time  °Name of Medication Number of pills taken  °Amount of Acetaminophen  °Pain Level  ° °Comments  °AM PM       °AM PM       °AM PM       °AM PM       °AM PM       °AM PM       °AM PM       °AM PM       °Total Daily amount of Acetaminophen °Do not take more than  3,000 mg per day    ° ° °Day 5   ° °Time  °Name of Medication Number °of pills taken  °Amount of Acetaminophen  °Pain Level  ° °Comments  °AM PM       °AM PM       °AM PM       °AM PM       °AM PM       °AM   PM       °AM PM       °AM PM       °Total Daily amount of Acetaminophen °Do not take more than  3,000 mg per day    ° ° ° °Day 6   ° °Time  °Name of Medication Number of pills °taken  °Amount of Acetaminophen  °Pain Level  °Comments  °AM PM       °AM PM       °AM PM       °AM PM       °AM PM       °AM PM       °AM PM       °AM PM       °Total Daily amount of Acetaminophen °Do not take more than  3,000 mg per day    ° ° °Day 7   ° °Time  °Name of Medication Number of pills taken  °Amount of Acetaminophen  °Pain Level  ° °Comments  °AM PM       °AM PM       °AM PM       °AM PM       °AM PM       °AM PM       °AM PM       °AM PM       °Total Daily amount of Acetaminophen °Do not take more than  3,000 mg per day    ° ° ° ° °For additional information about how and where to safely dispose of unused opioid °medications - https://www.morepowerfulnc.org ° °Disclaimer: This document  contains information and/or instructional materials adapted from [facility] for the typical patient with your condition. It does not replace medical advice from your health care provider because your experience may differ from that of the °typical patient. Talk to your health care provider if you have any questions about this °document, your condition or your treatment plan. °Adapted from [facility] ° °

## 2021-01-11 NOTE — ED Notes (Signed)
Pt A&OX4 ambulatory at d/c with independent steady gait. Pt verbalized understanding of d/c instructions, prescriptions and follow up care. 

## 2021-01-12 DIAGNOSIS — Z9049 Acquired absence of other specified parts of digestive tract: Secondary | ICD-10-CM | POA: Diagnosis not present

## 2021-01-12 DIAGNOSIS — R935 Abnormal findings on diagnostic imaging of other abdominal regions, including retroperitoneum: Secondary | ICD-10-CM | POA: Diagnosis not present

## 2021-01-12 DIAGNOSIS — K838 Other specified diseases of biliary tract: Secondary | ICD-10-CM | POA: Diagnosis not present

## 2021-01-12 LAB — COMPREHENSIVE METABOLIC PANEL
ALT: 24 U/L (ref 0–44)
AST: 26 U/L (ref 15–41)
Albumin: 3.3 g/dL — ABNORMAL LOW (ref 3.5–5.0)
Alkaline Phosphatase: 63 U/L (ref 38–126)
Anion gap: 7 (ref 5–15)
BUN: 6 mg/dL (ref 6–20)
CO2: 24 mmol/L (ref 22–32)
Calcium: 8.8 mg/dL — ABNORMAL LOW (ref 8.9–10.3)
Chloride: 107 mmol/L (ref 98–111)
Creatinine, Ser: 0.7 mg/dL (ref 0.44–1.00)
GFR, Estimated: >60 mL/min (ref >60)
Glucose, Bld: 129 mg/dL — ABNORMAL HIGH (ref 70–99)
Potassium: 4.3 mmol/L (ref 3.5–5.1)
Sodium: 138 mmol/L (ref 135–145)
Total Bilirubin: 0.6 mg/dL (ref 0.3–1.2)
Total Protein: 6.6 g/dL (ref 6.5–8.1)

## 2021-01-12 LAB — MAGNESIUM: Magnesium: 2.2 mg/dL (ref 1.7–2.4)

## 2021-01-12 LAB — CBC
HCT: 34.8 % — ABNORMAL LOW (ref 36.0–46.0)
Hemoglobin: 10.7 g/dL — ABNORMAL LOW (ref 12.0–15.0)
MCH: 25 pg — ABNORMAL LOW (ref 26.0–34.0)
MCHC: 30.7 g/dL (ref 30.0–36.0)
MCV: 81.3 fL (ref 80.0–100.0)
Platelets: 322 10*3/uL (ref 150–400)
RBC: 4.28 MIL/uL (ref 3.87–5.11)
RDW: 14.1 % (ref 11.5–15.5)
WBC: 12.3 10*3/uL — ABNORMAL HIGH (ref 4.0–10.5)
nRBC: 0 % (ref 0.0–0.2)

## 2021-01-12 MED ORDER — GADOBUTROL 1 MMOL/ML IV SOLN
8.0000 mL | Freq: Once | INTRAVENOUS | Status: AC | PRN
  Administered 2021-01-12: 8 mL via INTRAVENOUS

## 2021-01-12 MED ORDER — TRAMADOL HCL 50 MG PO TABS: 50.0000 mg | ORAL_TABLET | Freq: Four times a day (QID) | ORAL | 0 refills | Status: AC | PRN

## 2021-01-12 NOTE — Discharge Summary (Signed)
Patient ID: Lauren Aguilar MRN: [id number] DOB/AGE: 14-Jan-1991 30 y.o.  Admit date: 01/11/2021 Discharge date: 01/12/2021  Discharge Diagnoses Patient Active Problem List   Diagnosis Date Noted   Cholecystitis 01/11/2021   Abnormal Pap smear of cervix 11/08/2020   Vaginal delivery 10/07/2020   Shoulder dystocia, delivered 10/07/2020   Encounter for induction of labor 10/06/2020   [redacted] weeks gestation of pregnancy 09/20/2020   Iron deficiency anemia of mother during pregnancy 07/10/2020   Gestational diabetes 07/10/2020   Alpha thalassemia silent carrier 04/26/2020   Supervision of low-risk first pregnancy 03/28/2020   Obesity in pregnancy 03/28/2020   BMI 33.0-33.9,adult 03/28/2020   Cervical dysplasia 12/17/2015    Procedures OR 10/28 - lap chole with Dr. Milana Na Course: She was admitted postoperatively and recovered well. Her diet was advanced. She was mobilizing well. No n/v. Pain well controlled on oral analgesics.  She had MRCP postop for possible CBD stone although IOC was not feasible during surgery. MRCP showed no evident CBD stones.  LFTs normal 10/29  AF VSS NAD, comfortable and in good spirits RRR Abdomen is soft, NT/ND; incisions clean and dry. No palpable hernias or unexpected tenderness.  She is comfortable with and stable for discharge home today. Postoperative expectations have been reviewed. Her questions answered. She has been instructed to call our office Monday for a follow-up appointment for postoperative check.   Allergies as of 01/12/2021       Reactions   Amoxicillin Itching, Rash        Medication List     TAKE these medications    ibuprofen 600 MG tablet Commonly known as: ADVIL Take 1 tablet (600 mg total) by mouth every 6 (six) hours. What changed:  when to take this reasons to take this   Norgestimate-Ethinyl Estradiol Triphasic 0.18/0.215/0.25 MG-25 MCG tab Commonly known as: Ortho Tri-Cyclen Lo Take 1 tablet by mouth  daily.   pantoprazole 20 MG tablet Commonly known as: PROTONIX Take 1 tablet (20 mg total) by mouth daily for 14 days.   prenatal vitamin w/FE, FA 27-1 MG Tabs tablet Take 1 tablet by mouth daily at 12 noon.   sucralfate 1 g tablet Commonly known as: Carafate Take 1 tablet (1 g total) by mouth 4 (four) times daily -  with meals and at bedtime for 14 days.   traMADol 50 MG tablet Commonly known as: Ultram Take 1 tablet (50 mg total) by mouth every 6 (six) hours as needed for up to 5 days (postop pain not contorlled with tylenol and ibuprofen first).          Follow-up Information     [facility] up in 3 week(s).   Specialty: General Surgery Why: our office is working on your follow up.  Please call to confirm appointment. Contact information: [address] [place] [address] [phone]                 Stephanie Coup. Cliffton Asters, M.D. [facility]

## 2021-01-12 NOTE — Progress Notes (Signed)
Pt tolerated breakfast and lunch (regular diet) well.  Provided discharge education/instructions, all questions and concerns addressed, Pt not in acute distress, discharged home with belongings.

## 2021-01-12 NOTE — Plan of Care (Addendum)
Pt tolerated regular diet. Denies n/v.  Problem: Education: Goal: Knowledge of General Education information will improve Description: Including pain rating scale, medication(s)/side effects and non-pharmacologic comfort measures Outcome: Progressing   Problem: Activity: Goal: Risk for activity intolerance will decrease Outcome: Progressing   Problem: Pain Managment: Goal: General experience of comfort will improve Outcome: Progressing

## 2021-01-12 NOTE — Anesthesia Postprocedure Evaluation (Signed)
Anesthesia Post Note  Patient: Lauren Aguilar  Procedure(s) Performed: LAPAROSCOPIC CHOLECYSTECTOMY WITH POSSIBLE INTRAOPERATIVE CHOLANGIOGRAM (Abdomen)     Patient location during evaluation: PACU Anesthesia Type: General Level of consciousness: awake and alert Pain management: pain level controlled Vital Signs Assessment: post-procedure vital signs reviewed and stable Respiratory status: spontaneous breathing, nonlabored ventilation, respiratory function stable and patient connected to nasal cannula oxygen Cardiovascular status: blood pressure returned to baseline and stable Postop Assessment: no apparent nausea or vomiting Anesthetic complications: no   No notable events documented.  Last Vitals:  Vitals:   01/12/21 0530 01/12/21 0730  BP: 115/67 (!) 109/59  Pulse: 62 62  Resp: 16 16  Temp:  36.7 C  SpO2:  100%    Last Pain:  Vitals:   01/12/21 0730  TempSrc: Oral  PainSc:                  Kristy Catoe S

## 2021-01-13 ENCOUNTER — Encounter (HOSPITAL_COMMUNITY): Payer: Self-pay | Admitting: Surgery

## 2021-01-14 ENCOUNTER — Telehealth: Payer: Self-pay

## 2021-01-14 LAB — SURGICAL PATHOLOGY

## 2021-01-14 NOTE — Telephone Encounter (Signed)
Transition Care Management Unsuccessful Follow-up Telephone Call  Date of discharge and from where:  01/12/2021-[place]   Attempts:  1st Attempt  Reason for unsuccessful TCM follow-up call:  Unable to reach patient    

## 2021-01-15 DIAGNOSIS — Z419 Encounter for procedure for purposes other than remedying health state, unspecified: Secondary | ICD-10-CM | POA: Diagnosis not present

## 2021-01-15 NOTE — Telephone Encounter (Signed)
Transition Care Management Unsuccessful Follow-up Telephone Call  Date of discharge and from where:  01/12/2021 from [facility]  Attempts:  2nd Attempt  Reason for unsuccessful TCM follow-up call:  Unable to reach patient

## 2021-01-16 NOTE — Telephone Encounter (Signed)
Transition Care Management Follow-up Telephone Call Date of discharge and from where: 01/11/2021 from [facility] How have you been since you were released from the hospital? Pt stated that she is feeling better and better every day. Pt did not have any questions or concerns at this time.  Any questions or concerns? No  Items Reviewed: Did the pt receive and understand the discharge instructions provided? Yes  Medications obtained and verified? Yes  Other? No  Any new allergies since your discharge? No  Dietary orders reviewed? No Do you have support at home? Yes   Functional Questionnaire: (I = Independent and D = Dependent) ADLs: I  Bathing/Dressing- I  Meal Prep- I  Eating- I  Maintaining continence- I  Transferring/Ambulation- I  Managing Meds- I   Follow up appointments reviewed:  PCP Hospital f/u appt confirmed? No   Specialist Hospital f/u appt confirmed? Yes  Scheduled to see [facility] on 01/29/2021 @ 11:15am. Are transportation arrangements needed? No  If their condition worsens, is the pt aware to call PCP or go to the Emergency Dept.? Yes Was the patient provided with contact information for the PCP's office or ED? Yes Was to pt encouraged to call back with questions or concerns? Yes

## 2021-02-12 DIAGNOSIS — Z7689 Persons encountering health services in other specified circumstances: Secondary | ICD-10-CM | POA: Diagnosis not present

## 2021-02-14 DIAGNOSIS — Z419 Encounter for procedure for purposes other than remedying health state, unspecified: Secondary | ICD-10-CM | POA: Diagnosis not present

## 2021-03-17 DIAGNOSIS — Z419 Encounter for procedure for purposes other than remedying health state, unspecified: Secondary | ICD-10-CM | POA: Diagnosis not present

## 2021-04-17 DIAGNOSIS — Z419 Encounter for procedure for purposes other than remedying health state, unspecified: Secondary | ICD-10-CM | POA: Diagnosis not present

## 2021-05-15 DIAGNOSIS — Z419 Encounter for procedure for purposes other than remedying health state, unspecified: Secondary | ICD-10-CM | POA: Diagnosis not present

## 2021-06-12 ENCOUNTER — Other Ambulatory Visit (HOSPITAL_COMMUNITY)
Admission: RE | Admit: 2021-06-12 | Discharge: 2021-06-12 | Disposition: A | Discharge status: Home / Self Care | Payer: Medicaid Other | Source: Ambulatory Visit | Attending: Family Medicine | Admitting: Family Medicine

## 2021-06-12 ENCOUNTER — Encounter: Payer: Self-pay | Admitting: Family Medicine

## 2021-06-12 ENCOUNTER — Ambulatory Visit (INDEPENDENT_AMBULATORY_CARE_PROVIDER_SITE_OTHER): Payer: Medicaid Other | Admitting: Family Medicine

## 2021-06-12 VITALS — BP 123/75 | HR 64 | Wt 207.5 lb

## 2021-06-12 DIAGNOSIS — R87619 Unspecified abnormal cytological findings in specimens from cervix uteri: Secondary | ICD-10-CM | POA: Insufficient documentation

## 2021-06-12 DIAGNOSIS — Z01419 Encounter for gynecological examination (general) (routine) without abnormal findings: Secondary | ICD-10-CM | POA: Diagnosis not present

## 2021-06-12 LAB — POCT PREGNANCY, URINE: Preg Test, Ur: NEGATIVE

## 2021-06-12 MED ORDER — NORGESTIM-ETH ESTRAD TRIPHASIC 0.18/0.215/0.25 MG-25 MCG PO TABS: 1.0000 | ORAL_TABLET | Freq: Every day | ORAL | 3 refills | Status: AC

## 2021-06-12 NOTE — Progress Notes (Signed)
? ? ?GYNECOLOGY ANNUAL PREVENTATIVE CARE ENCOUNTER NOTE ? ?Subjective:  ? Lauren Aguilar is a 31 y.o. G5P1001 female here for a routine annual gynecologic exam.  Current complaints: none. Has history of abnormal PAP. WOuld like to restart OCPs..   Denies abnormal vaginal bleeding, discharge, pelvic pain, problems with intercourse or other gynecologic concerns.  ?  ?Gynecologic History ?Patient's last menstrual period was 05/31/2021 (exact date). ?Patient is sexually active  ?Contraception: OCP (estrogen/progesterone) ?Last Pap: [facility] +HPV. ? ? ?The pregnancy intention screening data noted above was reviewed. Potential methods of contraception were discussed. The patient elected to proceed with No data recorded. ? ? ?Obstetric History ?OB History  ?Gravida Para Term Preterm AB Living  ?1 1 1  0 0 1  ?SAB IAB Ectopic Multiple Live Births  ?0 0 0 0 1  ?  ?# Outcome Date GA Lbr Len/2nd Weight Sex Delivery Anes PTL Lv  ?1 Term 10/07/20 [redacted]w[redacted]d 43:47 / 01:56 8 lb 9.9 oz (3.909 kg) F Vag-Spont EPI  LIV  ?   Birth Comments: none  ? ? ?Past Medical History:  ?Diagnosis Date  ? Anemia   ? Gestational diabetes   ? Vaginal Pap smear, abnormal   ? ? ?Past Surgical History:  ?Procedure Laterality Date  ? CHOLECYSTECTOMY N/A 01/11/2021  ? Procedure: LAPAROSCOPIC CHOLECYSTECTOMY WITH POSSIBLE INTRAOPERATIVE CHOLANGIOGRAM;  Surgeon: 01/13/2021, MD;  Location: [facility] OR;  Service: General;  Laterality: N/A;  ? COLPOSCOPY  06/11/2020  ?    ? WISDOM TOOTH EXTRACTION    ? ? ?Current Outpatient Medications on File Prior to Visit  ?Medication Sig Dispense Refill  ? ibuprofen (ADVIL) 600 MG tablet Take 1 tablet (600 mg total) by mouth every 6 (six) hours. (Patient taking differently: Take 600 mg by mouth every 6 (six) hours as needed for headache or mild pain.) 30 tablet 0  ? Norgestimate-Ethinyl Estradiol Triphasic (ORTHO TRI-CYCLEN LO) 0.18/0.215/0.25 MG-25 MCG tab Take 1 tablet by mouth daily. (Patient not taking: Reported on 06/12/2021)  48 tablet 6  ? pantoprazole (PROTONIX) 20 MG tablet Take 1 tablet (20 mg total) by mouth daily for 14 days. 14 tablet 0  ? prenatal vitamin w/FE, FA (PRENATAL 1 + 1) 27-1 MG TABS tablet Take 1 tablet by mouth daily at 12 noon. (Patient not taking: Reported on 01/11/2021) 30 tablet 11  ? sucralfate (CARAFATE) 1 g tablet Take 1 tablet (1 g total) by mouth 4 (four) times daily -  with meals and at bedtime for 14 days. 56 tablet 0  ? ?No current facility-administered medications on file prior to visit.  ? ? ?Allergies  ?Allergen Reactions  ? Amoxicillin Itching and Rash  ? ? ?Social History  ? ?Socioeconomic History  ? Marital status: Single  ?  Spouse name: Not on file  ? Number of children: Not on file  ? Years of education: Not on file  ? Highest education level: Not on file  ?Occupational History  ? Not on file  ?Tobacco Use  ? Smoking status: Never  ? Smokeless tobacco: Never  ?Vaping Use  ? Vaping Use: Never used  ?Substance and Sexual Activity  ? Alcohol use: Not Currently  ?  Comment: RARE once a week on fridays.  ? Drug use: Never  ? Sexual activity: Not Currently  ?Other Topics Concern  ? Not on file  ?Social History Narrative  ? ** Merged History Encounter **  ?    ? ?Social Determinants of Health  ? ?01/13/2021  Strain: Not on file  ?Food Insecurity: Food Insecurity Present  ? Worried About Programme researcher, broadcasting/film/video in the Last Year: Sometimes true  ? Ran Out of Food in the Last Year: Sometimes true  ?Transportation Needs: Unmet Transportation Needs  ? Lack of Transportation (Medical): Yes  ? Lack of Transportation (Non-Medical): Yes  ?Physical Activity: Not on file  ?Stress: Not on file  ?Social Connections: Not on file  ?Intimate Partner Violence: Not on file  ? ? ?Family History  ?Problem Relation Age of Onset  ? Diabetes Father   ? Hypertension Father   ? Breast cancer Paternal Grandmother   ? Intellectual disability Paternal Grandmother   ? Diabetes Paternal Grandmother   ? Diabetes Mother   ?  Hypertension Mother   ? Colon cancer Paternal Grandfather   ? ? ?The following portions of the patient's history were reviewed and updated as appropriate: allergies, current medications, past family history, past medical history, past social history, past surgical history and problem list. ? ?Review of Systems ?Pertinent items are noted in HPI. ?  ?Objective:  ?BP 123/75   Pulse 64   Wt 207 lb 8 oz (94.1 kg)   LMP 05/31/2021 (Exact Date)   Breastfeeding No   BMI 35.62 kg/m?  ?Wt Readings from Last 3 Encounters:  ?06/12/21 207 lb 8 oz (94.1 kg)  ?01/11/21 193 lb (87.5 kg)  ?11/08/20 185 lb 3.2 oz (84 kg)  ?  ? ?Chaperone present during exam ? ?CONSTITUTIONAL: Well-developed, well-nourished female in no acute distress.  ?HENT:  Normocephalic, atraumatic, External right and left ear normal. Oropharynx is clear and moist ?EYES: Conjunctivae and EOM are normal. Pupils are equal, round, and reactive to light. No scleral icterus.  ?NECK: Normal range of motion, supple, no masses.  Normal thyroid.  ? ?CARDIOVASCULAR: Normal heart rate noted, regular rhythm ?RESPIRATORY: Clear to auscultation bilaterally. Effort and breath sounds normal, no problems with respiration noted. ?BREASTS: deferred ?ABDOMEN: Soft, normal bowel sounds, no distention noted.  No tenderness, rebound or guarding.  ?PELVIC: Normal appearing external genitalia; normal appearing vaginal mucosa and cervix.  No abnormal discharge noted.  MUSCULOSKELETAL: Normal range of motion. No tenderness.  No cyanosis, clubbing, or edema.  2+ distal pulses. ?SKIN: Skin is warm and dry. No rash noted. Not diaphoretic. No erythema. No pallor. ?NEUROLOGIC: Alert and oriented to person, place, and time. Normal reflexes, muscle tone coordination. No cranial nerve deficit noted. ?PSYCHIATRIC: Normal mood and affect. Normal behavior. Normal judgment and thought content. ? ?Assessment:  ?Annual gynecologic examination with pap smear ?  ?Plan:  ?1. Well Woman Exam ?Will  follow up results of pap smear and manage accordingly. ?STD testing discussed. Patient requested testing ? ?2. Abnormal cervical Papanicolaou smear, unspecified abnormal pap finding ? ?- Cytology - PAP( [facility]) ? ?Routine preventative health maintenance measures emphasized. ?Please refer to After Visit Summary for other counseling recommendations.  ? ? ?Candelaria Celeste, DO ?[facility] ?

## 2021-06-17 LAB — CYTOLOGY - PAP
Chlamydia: NEGATIVE
Comment: NEGATIVE
Comment: NORMAL
Neisseria Gonorrhea: NEGATIVE

## 2021-07-08 DIAGNOSIS — B084 Enteroviral vesicular stomatitis with exanthem: Secondary | ICD-10-CM | POA: Diagnosis not present

## 2021-08-22 DIAGNOSIS — J020 Streptococcal pharyngitis: Secondary | ICD-10-CM | POA: Diagnosis not present

## 2021-08-22 DIAGNOSIS — R509 Fever, unspecified: Secondary | ICD-10-CM | POA: Diagnosis not present

## 2023-02-21 IMAGING — US US MFM OB FOLLOW-UP
1 series · 13 of 28 positions shown · non-contrast
Comparison: none

[Series 1: us mfm ob follow-up · 13 of 82 slices shown]
[im 4/82]
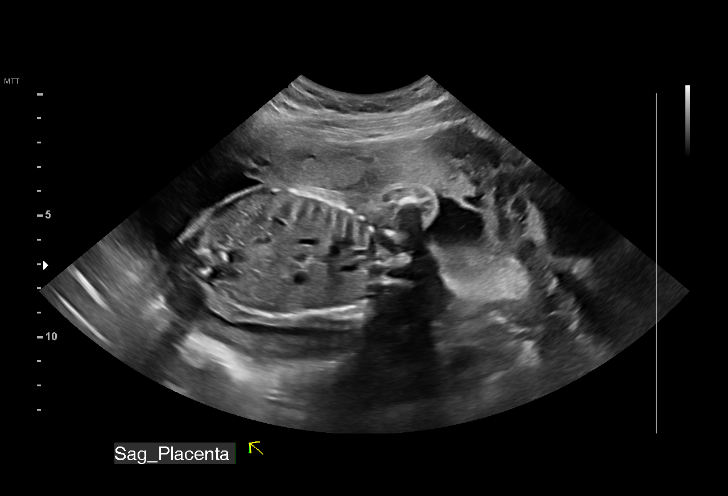
[im 10/82]
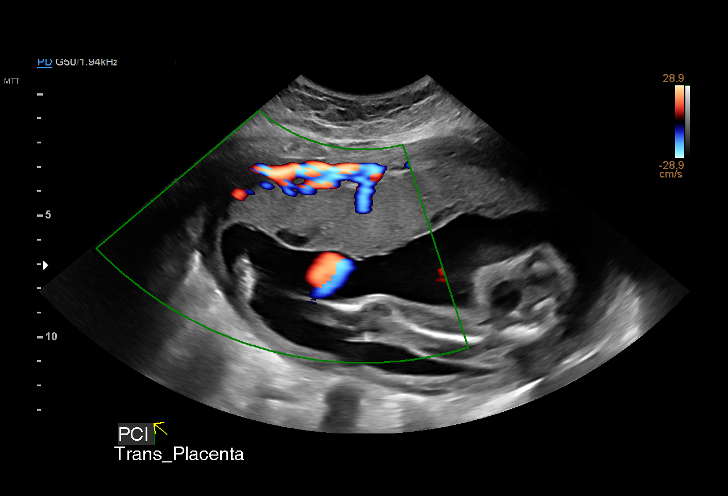
[im 16/82]
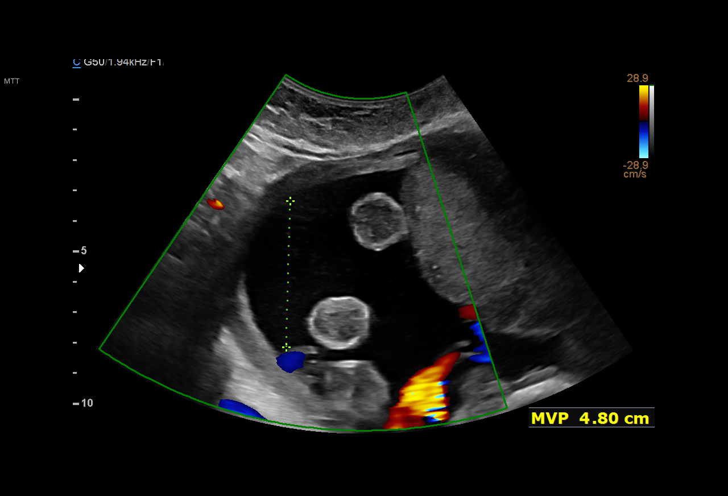
[im 22/82]
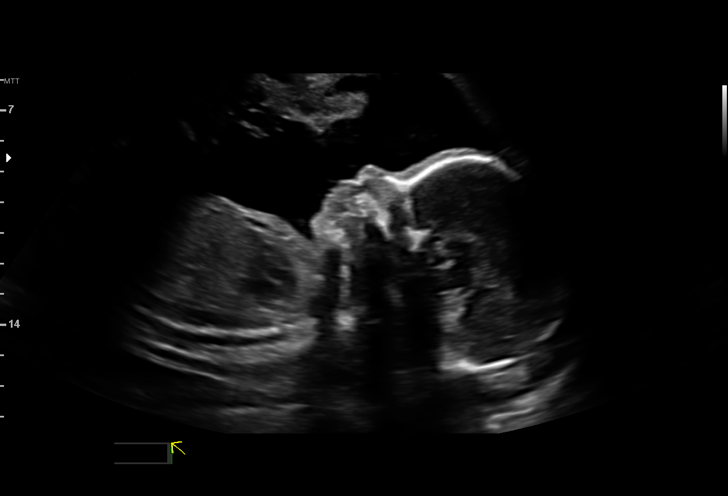
[im 28/82]
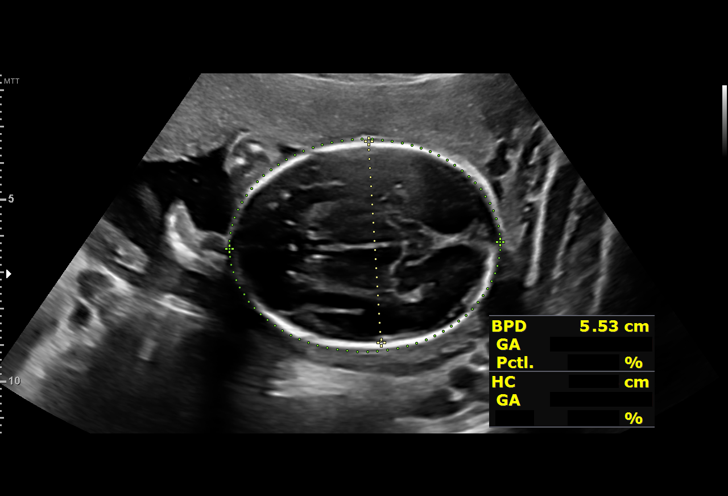
[im 34/82]
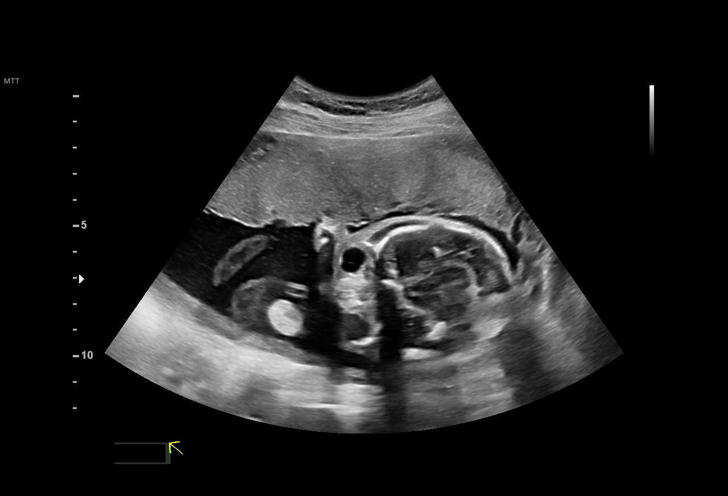
[im 43/82]
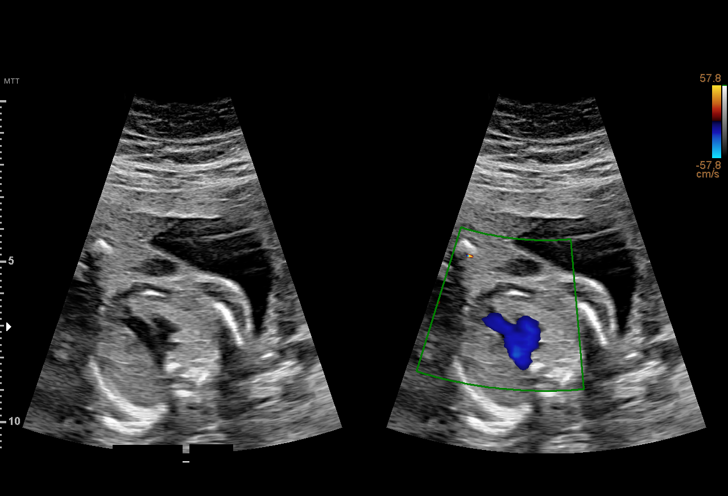
[im 49/82]
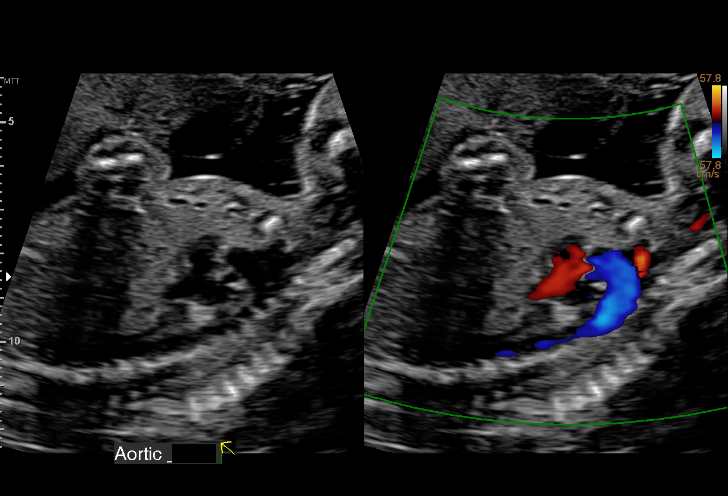
[im 55/82]
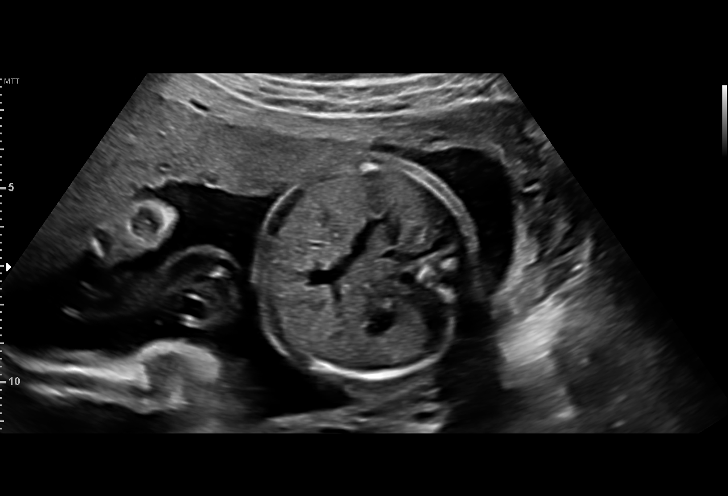
[im 61/82]
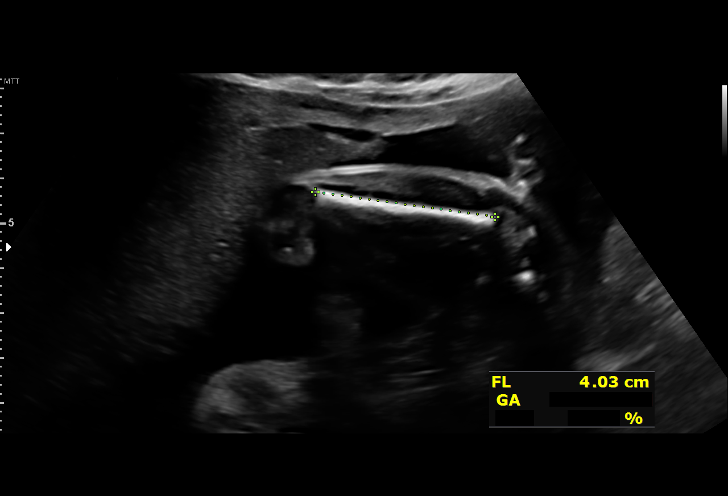
[im 67/82]
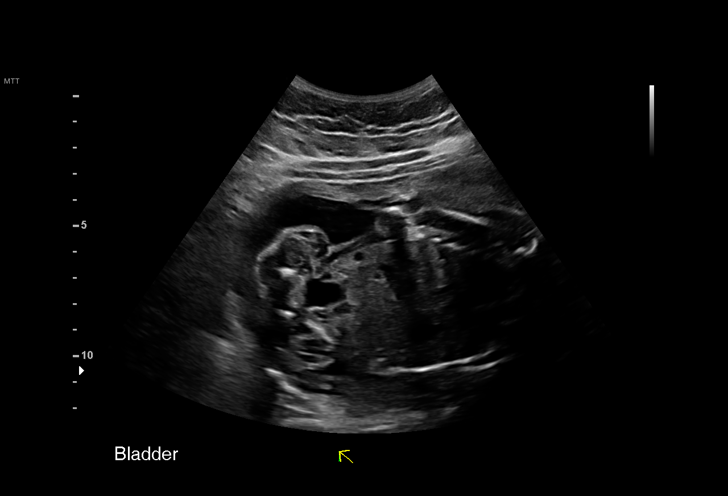
[im 73/82]
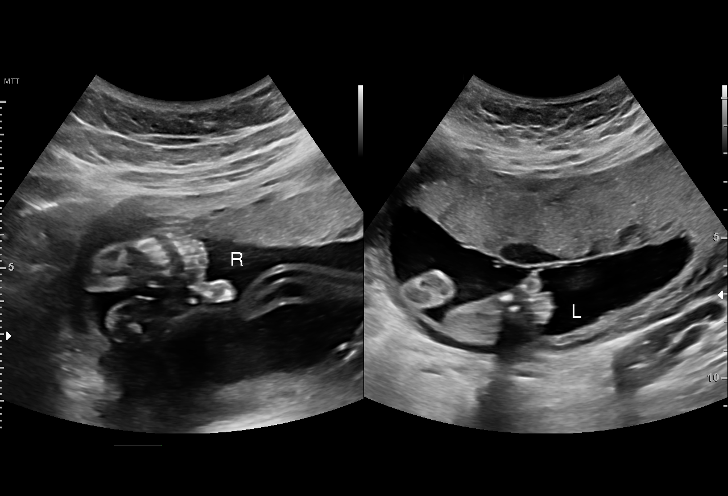
[im 79/82]
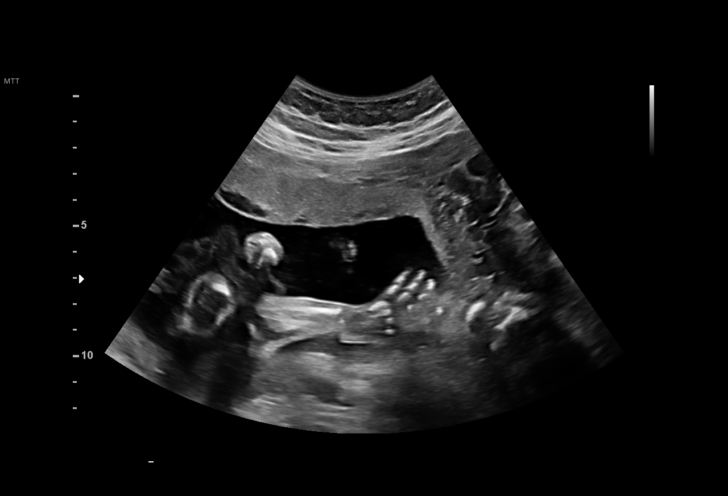

[13 of 28 positions shown; findings below may reference images not displayed]

Indications

 23 weeks gestation of pregnancy
 Obesity complicating pregnancy, second
 trimester BMI=33
 Alpha thalassemia silent carrier
 Low risk NIPS
 Encounter for other antenatal screening
 follow-up
Fetal Evaluation

 Num Of Fetuses:         1
 Fetal Heart Rate(bpm):  158
 Cardiac Activity:       Observed
 Presentation:           Cephalic
 Placenta:               Anterior
 P. Cord Insertion:      Visualized, central

 Amniotic Fluid
 AFI FV:      Within normal limits

                             Largest Pocket(cm)

Biometry

 BPD:      55.7  mm     G. Age:  23w 0d         39  %    CI:        71.94   %    70 - 86
                                                         FL/HC:      19.4   %    19.2 -
 HC:       209   mm     G. Age:  23w 0d         29  %    HC/AC:      1.09        1.05 -
 AC:      191.1  mm     G. Age:  23w 6d         64  %    FL/BPD:     72.9   %    71 - 87
 FL:       40.6  mm     G. Age:  23w 1d         38  %    FL/AC:      21.2   %    20 - 24
 CER:      25.4  mm     G. Age:  23w 1d         74  %

 LV:        6.8  mm
 CM:        7.1  mm

 Est. FW:     596  gm      1 lb 5 oz     58  %
OB History

 Gravidity:    1
Gestational Age

 LMP:           23w 1d        Date:  01/01/20                 EDD:   10/07/20
 U/S Today:     23w 2d                                        EDD:   10/06/20
 Best:          23w 1d     Det. By:  LMP  (01/01/20)          EDD:   10/07/20
Anatomy

 Cranium:               Appears normal         LVOT:                   Appears normal
 Cavum:                 Appears normal         Aortic Arch:            Appears normal
 Ventricles:            Appears normal         Ductal Arch:            Appears normal
 Choroid Plexus:        Appears normal         Diaphragm:              Appears normal
 Cerebellum:            Appears normal         Stomach:                Appears normal, left
                                                                       sided
 Posterior Fossa:       Appears normal         Abdomen:                Appears normal
 Nuchal Fold:           Appears normal         Abdominal Wall:         Appears nml (cord
                                                                       insert, abd wall)
 Face:                  Appears normal         Cord Vessels:           Appears normal (3
                        (orbits and profile)                           vessel cord)
 Lips:                  Appears normal         Kidneys:                Appear normal
 Palate:                Not well visualized    Bladder:                Appears normal
 Thoracic:              Appears normal         Spine:                  Appears normal
 Heart:                 Appears normal         Upper Extremities:      Previously seen
                        (4CH, axis, and
                        situs)
 RVOT:                  Appears normal         Lower Extremities:      Previously seen

 Other:  Heels/feet and open hands/5th digits visualized. Technically difficult
         due to maternal habitus and fetal position.
Cervix Uterus Adnexa

 Cervix
 Length:           3.29  cm.
 Normal appearance by transabdominal scan.

 Uterus
 No abnormality visualized.

 Right Ovary
 Within normal limits.

 Left Ovary
 Within normal limits.
 Cul De Sac
 No free fluid seen.

 Adnexa
 No abnormality visualized.
Impression

 Patient returned for completion of fetal anatomy .Fetal growth
 is appropriate for gestational age .Amniotic fluid is normal
 and good fetal activity is seen .Fetal anatomical survey was
 completed and appears normal.
Recommendations

 Follow-up scans as clinically indicated.
                 Gz, Kevin
